# Patient Record
Sex: Female | Born: 1970 | Race: White | Hispanic: No | Smoking: Never smoker
Health system: Southern US, Community
[De-identification: ages and names within clinical notes are randomized; demographics above are authoritative.]

## PROBLEM LIST (undated history)

## (undated) ENCOUNTER — Emergency Department (HOSPITAL_COMMUNITY): Payer: 59

## (undated) ENCOUNTER — Inpatient Hospital Stay (HOSPITAL_COMMUNITY): Payer: Self-pay

## (undated) DIAGNOSIS — Z1509 Genetic susceptibility to other malignant neoplasm: Secondary | ICD-10-CM

## (undated) DIAGNOSIS — F329 Major depressive disorder, single episode, unspecified: Secondary | ICD-10-CM

## (undated) DIAGNOSIS — Z800 Family history of malignant neoplasm of digestive organs: Secondary | ICD-10-CM

## (undated) DIAGNOSIS — K635 Polyp of colon: Secondary | ICD-10-CM

## (undated) DIAGNOSIS — T8859XA Other complications of anesthesia, initial encounter: Secondary | ICD-10-CM

## (undated) DIAGNOSIS — K8020 Calculus of gallbladder without cholecystitis without obstruction: Secondary | ICD-10-CM

## (undated) DIAGNOSIS — Z8619 Personal history of other infectious and parasitic diseases: Secondary | ICD-10-CM

## (undated) DIAGNOSIS — K219 Gastro-esophageal reflux disease without esophagitis: Secondary | ICD-10-CM

## (undated) DIAGNOSIS — F53 Postpartum depression: Secondary | ICD-10-CM

## (undated) DIAGNOSIS — F32A Depression, unspecified: Secondary | ICD-10-CM

## (undated) DIAGNOSIS — E669 Obesity, unspecified: Secondary | ICD-10-CM

## (undated) DIAGNOSIS — T4145XA Adverse effect of unspecified anesthetic, initial encounter: Secondary | ICD-10-CM

## (undated) DIAGNOSIS — E039 Hypothyroidism, unspecified: Secondary | ICD-10-CM

## (undated) DIAGNOSIS — IMO0002 Reserved for concepts with insufficient information to code with codable children: Secondary | ICD-10-CM

## (undated) DIAGNOSIS — R87619 Unspecified abnormal cytological findings in specimens from cervix uteri: Secondary | ICD-10-CM

## (undated) DIAGNOSIS — O99345 Other mental disorders complicating the puerperium: Secondary | ICD-10-CM

## (undated) DIAGNOSIS — O09529 Supervision of elderly multigravida, unspecified trimester: Secondary | ICD-10-CM

## (undated) HISTORY — DX: Major depressive disorder, single episode, unspecified: F32.9

## (undated) HISTORY — DX: Obesity, unspecified: E66.9

## (undated) HISTORY — DX: Adverse effect of unspecified anesthetic, initial encounter: T41.45XA

## (undated) HISTORY — PX: COLPOSCOPY: SHX161

## (undated) HISTORY — DX: Other complications of anesthesia, initial encounter: T88.59XA

## (undated) HISTORY — DX: Unspecified abnormal cytological findings in specimens from cervix uteri: R87.619

## (undated) HISTORY — DX: Genetic susceptibility to other malignant neoplasm: Z15.09

## (undated) HISTORY — PX: MOUTH SURGERY: SHX715

## (undated) HISTORY — PX: EYE SURGERY: SHX253

## (undated) HISTORY — DX: Family history of malignant neoplasm of digestive organs: Z80.0

## (undated) HISTORY — DX: Polyp of colon: K63.5

## (undated) HISTORY — DX: Reserved for concepts with insufficient information to code with codable children: IMO0002

## (undated) HISTORY — PX: LAPAROSCOPY: SHX197

## (undated) HISTORY — DX: Depression, unspecified: F32.A

## (undated) HISTORY — PX: TONSILLECTOMY: SUR1361

## (undated) HISTORY — DX: Other mental disorders complicating the puerperium: O99.345

## (undated) HISTORY — DX: Supervision of elderly multigravida, unspecified trimester: O09.529

## (undated) HISTORY — DX: Personal history of other infectious and parasitic diseases: Z86.19

## (undated) HISTORY — DX: Postpartum depression: F53.0

---

## 2009-12-31 ENCOUNTER — Encounter: Admission: RE | Admit: 2009-12-31 | Discharge: 2009-12-31 | Payer: Self-pay | Admitting: Obstetrics and Gynecology

## 2011-01-17 ENCOUNTER — Ambulatory Visit (HOSPITAL_COMMUNITY): Admission: RE | Admit: 2011-01-17 | Payer: Self-pay | Source: Ambulatory Visit

## 2011-01-17 ENCOUNTER — Ambulatory Visit (HOSPITAL_COMMUNITY)
Admission: RE | Admit: 2011-01-17 | Discharge: 2011-01-17 | Disposition: A | Discharge status: Home / Self Care | Payer: 59 | Source: Ambulatory Visit | Attending: Interventional Cardiology | Admitting: Interventional Cardiology

## 2011-01-17 DIAGNOSIS — R079 Chest pain, unspecified: Secondary | ICD-10-CM | POA: Insufficient documentation

## 2011-02-27 ENCOUNTER — Other Ambulatory Visit: Payer: Self-pay

## 2011-07-24 ENCOUNTER — Other Ambulatory Visit: Payer: Self-pay

## 2011-08-14 LAB — OB RESULTS CONSOLE HEPATITIS B SURFACE ANTIGEN: Hepatitis B Surface Ag: NEGATIVE

## 2011-08-14 LAB — OB RESULTS CONSOLE RPR: RPR: NONREACTIVE

## 2011-09-04 ENCOUNTER — Other Ambulatory Visit: Payer: Self-pay

## 2011-09-04 ENCOUNTER — Other Ambulatory Visit (HOSPITAL_COMMUNITY)
Admission: RE | Admit: 2011-09-04 | Discharge: 2011-09-04 | Disposition: A | Discharge status: Home / Self Care | Payer: 59 | Source: Ambulatory Visit | Attending: Obstetrics and Gynecology | Admitting: Obstetrics and Gynecology

## 2011-09-04 DIAGNOSIS — Z113 Encounter for screening for infections with a predominantly sexual mode of transmission: Secondary | ICD-10-CM | POA: Insufficient documentation

## 2011-09-04 DIAGNOSIS — Z01419 Encounter for gynecological examination (general) (routine) without abnormal findings: Secondary | ICD-10-CM | POA: Insufficient documentation

## 2011-09-04 LAB — OB RESULTS CONSOLE GC/CHLAMYDIA
Chlamydia: NEGATIVE
Gonorrhea: NEGATIVE

## 2011-10-15 ENCOUNTER — Other Ambulatory Visit: Payer: Self-pay | Admitting: Obstetrics and Gynecology

## 2011-10-15 DIAGNOSIS — Z3689 Encounter for other specified antenatal screening: Secondary | ICD-10-CM

## 2011-10-15 DIAGNOSIS — O09529 Supervision of elderly multigravida, unspecified trimester: Secondary | ICD-10-CM

## 2011-10-27 ENCOUNTER — Encounter (HOSPITAL_COMMUNITY): Payer: Self-pay

## 2011-10-27 ENCOUNTER — Ambulatory Visit (HOSPITAL_COMMUNITY)
Admission: RE | Admit: 2011-10-27 | Discharge: 2011-10-27 | Disposition: A | Discharge status: Home / Self Care | Payer: 59 | Source: Ambulatory Visit | Attending: Obstetrics and Gynecology | Admitting: Obstetrics and Gynecology

## 2011-10-27 VITALS — BP 121/74 | HR 90 | Wt 279.0 lb

## 2011-10-27 DIAGNOSIS — Z3689 Encounter for other specified antenatal screening: Secondary | ICD-10-CM

## 2011-10-27 DIAGNOSIS — Z1389 Encounter for screening for other disorder: Secondary | ICD-10-CM | POA: Insufficient documentation

## 2011-10-27 DIAGNOSIS — Z363 Encounter for antenatal screening for malformations: Secondary | ICD-10-CM | POA: Insufficient documentation

## 2011-10-27 DIAGNOSIS — O358XX0 Maternal care for other (suspected) fetal abnormality and damage, not applicable or unspecified: Secondary | ICD-10-CM | POA: Insufficient documentation

## 2011-10-27 DIAGNOSIS — E669 Obesity, unspecified: Secondary | ICD-10-CM | POA: Insufficient documentation

## 2011-10-27 DIAGNOSIS — O09529 Supervision of elderly multigravida, unspecified trimester: Secondary | ICD-10-CM | POA: Insufficient documentation

## 2011-10-27 NOTE — Progress Notes (Signed)
Grace Graves  was seen today for an ultrasound appointment.  See full report in [facility].  Alpha Gula, MD  Single IUP at 18 0/7 weeks Normal detailed fetal anatomy; somewhat limited views of the fetal heart were obtained (RVOT) No markers associated with aneuploidy were noted Normal amniotic fluid volume  Patient had low risk cell free fetal DNA testing ([facility])  Recommend follow up ultrasound in 4 weeks to reevaluate the fetal heart.

## 2011-11-26 ENCOUNTER — Ambulatory Visit (HOSPITAL_COMMUNITY)
Admission: RE | Admit: 2011-11-26 | Discharge: 2011-11-26 | Disposition: A | Discharge status: Home / Self Care | Payer: 59 | Source: Ambulatory Visit | Attending: Obstetrics and Gynecology | Admitting: Obstetrics and Gynecology

## 2011-11-26 DIAGNOSIS — O358XX0 Maternal care for other (suspected) fetal abnormality and damage, not applicable or unspecified: Secondary | ICD-10-CM | POA: Insufficient documentation

## 2011-11-26 DIAGNOSIS — Z1389 Encounter for screening for other disorder: Secondary | ICD-10-CM | POA: Insufficient documentation

## 2011-11-26 DIAGNOSIS — O09529 Supervision of elderly multigravida, unspecified trimester: Secondary | ICD-10-CM | POA: Insufficient documentation

## 2011-11-26 DIAGNOSIS — Z363 Encounter for antenatal screening for malformations: Secondary | ICD-10-CM | POA: Insufficient documentation

## 2011-11-26 DIAGNOSIS — O99210 Obesity complicating pregnancy, unspecified trimester: Secondary | ICD-10-CM | POA: Insufficient documentation

## 2011-11-26 DIAGNOSIS — Z3689 Encounter for other specified antenatal screening: Secondary | ICD-10-CM

## 2011-11-26 DIAGNOSIS — E669 Obesity, unspecified: Secondary | ICD-10-CM | POA: Insufficient documentation

## 2011-11-26 NOTE — Progress Notes (Signed)
Grace Graves  was seen today for an ultrasound appointment.  See full report in [facility].  Alpha Gula, MD  Single IUP at 22 2/7 weeks Normal interval anatomy. Normal fetal heart views were obtained. No markers associated with aneuploidy were noted Normal amniotic fluid volume  Patient had low risk cell free fetal DNA testing ([facility])  Recommend follow up ultrasound as clinically indicated

## 2012-02-13 ENCOUNTER — Inpatient Hospital Stay (HOSPITAL_COMMUNITY)
Admission: AD | Admit: 2012-02-13 | Discharge: 2012-02-13 | Disposition: A | Discharge status: Home / Self Care | Payer: 59 | Source: Ambulatory Visit | Attending: Obstetrics and Gynecology | Admitting: Obstetrics and Gynecology

## 2012-02-13 ENCOUNTER — Encounter (HOSPITAL_COMMUNITY): Payer: Self-pay | Admitting: *Deleted

## 2012-02-13 ENCOUNTER — Other Ambulatory Visit: Payer: Self-pay | Admitting: Obstetrics and Gynecology

## 2012-02-13 DIAGNOSIS — O99891 Other specified diseases and conditions complicating pregnancy: Secondary | ICD-10-CM | POA: Insufficient documentation

## 2012-02-13 LAB — WET PREP, GENITAL: Yeast Wet Prep HPF POC: NONE SEEN

## 2012-02-13 LAB — AMNISURE RUPTURE OF MEMBRANE (ROM) NOT AT ARMC: Amnisure ROM: NEGATIVE

## 2012-02-13 NOTE — MAU Note (Signed)
Pt sent from MD office, ? SROM, had neg fern in MD office, but pt is feeling wet, having to wear pad.  Some sharp lower abd pain, no bleeding.

## 2012-02-13 NOTE — MAU Provider Note (Signed)
S: 41 y.o. [id number] @[redacted]w[redacted]d  presents to MAU from office for r/o SROM.  She reports leaking clear fluid enough to feel wet x2-3 days, but has not required a pad for this.  She reports good fetal movement, denies vaginal bleeding, vaginal itching/burning, urinary symptoms, h/a, dizziness, n/v, or fever/chills.    O: BP 131/78  Pulse 94  Temp 98.2 F (36.8 C) (Oral)  Resp 18  LMP 06/23/2011  Results for orders placed during the hospital encounter of 02/13/12 (from the past 24 hour(s))  AMNISURE RUPTURE OF MEMBRANE (ROM)     Status: Normal   Collection Time   02/13/12  6:10 PM      Component Value Range   Amnisure ROM NEGATIVE    WET PREP, GENITAL     Status: Abnormal   Collection Time   02/13/12  6:10 PM      Component Value Range   Yeast Wet Prep HPF POC NONE SEEN  NONE SEEN   Trich, Wet Prep NONE SEEN  NONE SEEN   Clue Cells Wet Prep HPF POC NONE SEEN  NONE SEEN   WBC, Wet Prep HPF POC MODERATE (*) NONE SEEN   Dilation: 1 Effacement (%): Thick Cervical Position: Posterior Station: -3 Presentation: Vertex Exam by:: L. Leftwich-Kirby CNM  EFM Category I, with irritability on toco, mild to palpation  A: Intact membranes  P: RN to call Dr Dion Body to report results   Sharen Counter Certified Nurse-Midwife

## 2012-02-23 ENCOUNTER — Observation Stay (HOSPITAL_COMMUNITY): Payer: 59

## 2012-02-23 ENCOUNTER — Observation Stay (HOSPITAL_COMMUNITY)
Admission: AD | Admit: 2012-02-23 | Discharge: 2012-02-25 | Disposition: A | Discharge status: Home / Self Care | Payer: 59 | Source: Ambulatory Visit | Attending: Obstetrics and Gynecology | Admitting: Obstetrics and Gynecology

## 2012-02-23 ENCOUNTER — Encounter (HOSPITAL_COMMUNITY): Payer: Self-pay | Admitting: *Deleted

## 2012-02-23 DIAGNOSIS — O4700 False labor before 37 completed weeks of gestation, unspecified trimester: Principal | ICD-10-CM | POA: Insufficient documentation

## 2012-02-23 DIAGNOSIS — O09529 Supervision of elderly multigravida, unspecified trimester: Secondary | ICD-10-CM | POA: Insufficient documentation

## 2012-02-23 DIAGNOSIS — O368390 Maternal care for abnormalities of the fetal heart rate or rhythm, unspecified trimester, not applicable or unspecified: Secondary | ICD-10-CM | POA: Insufficient documentation

## 2012-02-23 LAB — COMPREHENSIVE METABOLIC PANEL
AST: 12 U/L (ref 0–37)
Albumin: 2.6 g/dL — ABNORMAL LOW (ref 3.5–5.2)
Alkaline Phosphatase: 103 U/L (ref 39–117)
CO2: 22 mEq/L (ref 19–32)
Chloride: 102 mEq/L (ref 96–112)
Potassium: 3.9 mEq/L (ref 3.5–5.1)
Total Bilirubin: 0.3 mg/dL (ref 0.3–1.2)

## 2012-02-23 LAB — TYPE AND SCREEN: Antibody Screen: NEGATIVE

## 2012-02-23 LAB — CBC
HCT: 34.9 % — ABNORMAL LOW (ref 36.0–46.0)
Hemoglobin: 11.2 g/dL — ABNORMAL LOW (ref 12.0–15.0)
MCH: 28.1 pg (ref 26.0–34.0)
MCHC: 32.1 g/dL (ref 30.0–36.0)
RDW: 15 % (ref 11.5–15.5)

## 2012-02-23 LAB — OB RESULTS CONSOLE GBS
GBS: NEGATIVE
GBS: NEGATIVE
GBS: NEGATIVE

## 2012-02-23 MED ORDER — NIFEDIPINE 10 MG PO CAPS
10.0000 mg | ORAL_CAPSULE | Freq: Four times a day (QID) | ORAL | Status: DC
  Administered 2012-02-24 (×2): 10 mg via ORAL
  Filled 2012-02-23: qty 2
  Filled 2012-02-23 (×2): qty 1

## 2012-02-23 MED ORDER — SODIUM CHLORIDE 0.9 % IJ SOLN: 3.0000 mL | INTRAMUSCULAR | Status: DC | PRN

## 2012-02-23 MED ORDER — LACTATED RINGERS IV BOLUS (SEPSIS)
1000.0000 mL | Freq: Once | INTRAVENOUS | Status: AC
  Administered 2012-02-23: 1000 mL via INTRAVENOUS

## 2012-02-23 MED ORDER — SODIUM CHLORIDE 0.9 % IV SOLN: 250.0000 mL | INTRAVENOUS | Status: DC | PRN

## 2012-02-23 MED ORDER — PENICILLIN G POTASSIUM 5000000 UNITS IJ SOLR
5.0000 10*6.[IU] | Freq: Once | INTRAVENOUS | Status: AC
  Administered 2012-02-23: 5 10*6.[IU] via INTRAVENOUS
  Filled 2012-02-23: qty 5

## 2012-02-23 MED ORDER — DOCUSATE SODIUM 100 MG PO CAPS
100.0000 mg | ORAL_CAPSULE | Freq: Every day | ORAL | Status: DC
  Administered 2012-02-24: 100 mg via ORAL
  Filled 2012-02-23: qty 1

## 2012-02-23 MED ORDER — LACTATED RINGERS IV SOLN
INTRAVENOUS | Status: DC
  Administered 2012-02-23 – 2012-02-24 (×3): via INTRAVENOUS

## 2012-02-23 MED ORDER — ZOLPIDEM TARTRATE 5 MG PO TABS
5.0000 mg | ORAL_TABLET | Freq: Every evening | ORAL | Status: DC | PRN
  Administered 2012-02-24: 5 mg via ORAL
  Filled 2012-02-23: qty 1

## 2012-02-23 MED ORDER — ACETAMINOPHEN 325 MG PO TABS
650.0000 mg | ORAL_TABLET | ORAL | Status: DC | PRN
  Administered 2012-02-24 (×3): 650 mg via ORAL
  Filled 2012-02-23 (×3): qty 2

## 2012-02-23 MED ORDER — PENICILLIN G POTASSIUM 5000000 UNITS IJ SOLR
2.5000 10*6.[IU] | INTRAVENOUS | Status: DC
  Administered 2012-02-23 – 2012-02-25 (×8): 2.5 10*6.[IU] via INTRAVENOUS
  Filled 2012-02-23 (×12): qty 2.5

## 2012-02-23 MED ORDER — SODIUM CHLORIDE 0.9 % IJ SOLN: 3.0000 mL | Freq: Two times a day (BID) | INTRAMUSCULAR | Status: DC

## 2012-02-23 MED ORDER — CALCIUM CARBONATE ANTACID 500 MG PO CHEW: 2.0000 | CHEWABLE_TABLET | ORAL | Status: DC | PRN

## 2012-02-23 MED ORDER — PRENATAL MULTIVITAMIN CH
1.0000 | ORAL_TABLET | Freq: Every day | ORAL | Status: DC
  Administered 2012-02-24: 1 via ORAL
  Filled 2012-02-23: qty 1

## 2012-02-23 MED ORDER — NIFEDIPINE 10 MG PO CAPS
20.0000 mg | ORAL_CAPSULE | Freq: Once | ORAL | Status: AC
  Administered 2012-02-23: 20 mg via ORAL

## 2012-02-23 NOTE — Consult Note (Signed)
[facility]  Neonatal Medicine Consultation       02/23/2012    10:19 PM  I was called at the request of the patient's obstetrician (DR. Varnado) to speak to this patient due to preterm labor at 35 weeks.  I discussed likely clinical course for a baby born this early, including mortality, length of stay, respiratory distress, infection, feeding.  I answered the patient's questions.   _____________________ Electronically Signed By: Angelita Ingles, MD Neonatologist

## 2012-02-23 NOTE — H&P (Signed)
Grace Graves is a 42 y.o. female G5 P51 at 32 0/7 weeks c/w 9 week ultrasound admitted for onset of contractions.  Pt works on feet in hospital and reports contractions started more than 6 hours prior to arrival to office.  Denied LOF or vaginal bleeding.  Denied sexual intercourse within the last 72 hours.  1-2 weeks ago pt was sent to rule out PTL  Due to contractions.  Cervix at that time was 1/thick/high.  Uterine irritability noted, no cervical change.  Pt sent home and rested over the weekend and stated that contractions resolved.  Pt reduced work hours and was stable.   PNC complicated by AMA.  [facility] after 10 weeks was low risk.  Level 2 ultrasound was wnl.  Growth ultrasounds have been reassuring.  Pt with a h/o pre-hypertension prior to pregnancy.  Baseline 24 hour urine WNL.  BP has been normal to high normal.   Maternal Medical History:  Reason for admission: Reason for admission: contractions and nausea.  Contractions: Onset was 6-12 hours ago.   Frequency: regular.   Duration is approximately 5 minutes.   Perceived severity is mild.    Fetal activity: Perceived fetal activity is normal.    Prenatal complications: Hypertension.   Pt with a h/o pre-hypertension prior to pregnancy.  BP has been high normal.  24 hr urine protein (baseline) wnl.  Prenatal Complications - Diabetes: none.    OB History    Grav Para Term Preterm Abortions TAB SAB Ect Mult Living   4 2 2  0 1 0 1 0 0 2     Past Medical History  Diagnosis Date  . No pertinent past medical history    Past Surgical History  Procedure Date  . Eye surgery   . Tonsillectomy   . Laparoscopy    Family History: family history includes Arthritis in her mother; COPD in her mother; Cancer in her mother; Depression in her father, mother, and sister; Diabetes in her mother; Heart disease in her mother; Hyperlipidemia in her mother; Hypertension in her father and mother; and Kidney disease in her sister. Social History:   reports that she has never smoked. She does not have any smokeless tobacco history on file. She reports that she does not drink alcohol or use illicit drugs.   Prenatal Transfer Tool  Maternal Diabetes: No Genetic Screening: Normal Maternal Ultrasounds/Referrals: Normal Fetal Ultrasounds or other Referrals:  None Maternal Substance Abuse:  No Significant Maternal Medications:  None Significant Maternal Lab Results:  None Other Comments:  GBS pending. AMA, morbid obesity  Review of Systems  Eyes: Negative for blurred vision.  Respiratory: Negative for sputum production.   Gastrointestinal: Positive for nausea and abdominal pain.  Genitourinary:       Denies LOF, Vaginal bleeding, loss of mucus plug.      Blood pressure 136/73, pulse 97, temperature 98 F (36.7 C), temperature source Oral, resp. rate 18, height 5\' 9"  (1.753 m), weight 126.1 kg (278 lb), last menstrual period 06/23/2011. Maternal Exam:  Uterine Assessment: Contraction strength is mild.  Contraction duration is 5 minutes. Contraction frequency is regular.   Abdomen: Fundal height is 36 cm.   Estimated fetal weight is By ultrasound on 12/17, not macrosomic.   Fetal presentation: vertex  Introitus: Normal vulva. Normal vagina.  Ferning test: not done.  Nitrazine test: not done.  Pelvis: adequate for delivery.   Cervix: Cervix evaluated by sterile speculum exam and digital exam.   3-4/30/high  Fetal Exam Fetal Monitor  Review: Variability: moderate (6-25 bpm).   Pattern: accelerations present.   Late decel noted with 1 contraction.  Fetal State Assessment: Category I - tracings are normal.     Physical Exam  Constitutional: She is oriented to person, place, and time. She appears well-developed and well-nourished. No distress.  HENT:  Head: Normocephalic and atraumatic.  Eyes: EOM are normal.  Neck: Normal range of motion.  Cardiovascular: Normal rate, regular rhythm and normal heart sounds.     Respiratory: Effort normal and breath sounds normal. No respiratory distress.  GI: There is no tenderness.  Genitourinary: Vagina normal and uterus normal.  Neurological: She is alert and oriented to person, place, and time.  Skin: Skin is warm and dry. She is not diaphoretic.  Psychiatric: She has a normal mood and affect.    Prenatal labs: ABO, Rh:   Antibody:   Rubella:   RPR:    HBsAg:    HIV:    GBS:     Assessment/Plan: PTL at 35 0/7 weeks.   Extreme AMA, genetic screening negative. H/o elevated BP prior to pregnancy. Given Procardia for tocolysis and IVF, which has seemed to resolve contractions.  R/o UTI by culture.  Wet prep negative 1+ weeks ago. FFN collected results pending. GBS collected in office today.  PCN for GBS prophylaxis until stable. Ultrasound done for presentation, EFW.  Per pt, she is breech and ~5 1/2 pounds.  Preliminary and/or final report pending. Pt counseled if labor progresses at this time, primary c-section recommended due to breech presentation. Deceleration x 1, Negative CST.  Continuous fetal monitoring. NICU consult ordered. Discussed plan at length with pt and husband.  All questions answered.  Dr. Richardson Dopp to see pt tomorrow,will check out in am.  Geryl Rankins 02/23/2012, 8:34 PM

## 2012-02-24 ENCOUNTER — Inpatient Hospital Stay (HOSPITAL_COMMUNITY): Payer: 59

## 2012-02-24 ENCOUNTER — Encounter (HOSPITAL_COMMUNITY): Payer: 59

## 2012-02-24 LAB — URINE CULTURE

## 2012-02-24 LAB — ABO/RH: ABO/RH(D): A POS

## 2012-02-24 NOTE — Progress Notes (Signed)
Patient ID: Grace Graves, female   DOB: May 10, 1970, 42 y.o.   MRN: [id number] Recommendation d/w Dr. Marjo Bicker MFM.  Consultation very much appreciated. Will discontinue Procardia and start 24 hour urine collection. Cont fetal monitoring and tocometry.   Discussed plan with Dr. Richardson Dopp.

## 2012-02-24 NOTE — Progress Notes (Signed)
42 y/o at 35 wks and 1 day admitted by Dr. Dion Body yesterday with preterm labor. She was started on procardia. She ws 3 cm on admission. She denies current contractions. /+ FM no lof no vaginal bleeding. BPP today was 8/8.Marland Kitchen On u/s her baby was frank breech presentation.   AFVSS FHR baseline 130's moderate variability: accelerations are present .Marland Kitchen No current decelerations.  Toco no contractions noted.  Abd gravid nontender  Ext no edema   A/P 35 wks 1day with preterm labor / breech presentation/ AMA/ morbid obesity  MFM consult reviewed with Dr. Dion Body. Discontinue procardia. Continue inpatient management overnight if stable d/c home in AM Plan for 24 hour urine collection Antenatal testing outpatient.

## 2012-02-24 NOTE — Progress Notes (Signed)
UR completed 

## 2012-02-24 NOTE — Consult Note (Signed)
MFM Staff Note  Discussion:   By way of consultation, I spoke to your patient about her care.  The patient is beyond 34 weeks with advanced cervical dilation.  While complications resulting from use of tocolytic agents are rare in the well monitored patient, current guidelines by ACOG do not support aggressive tocolytic therapy beyond 34 weeks.  While it can be done (tocolysis), my recommendation is not to do so.  If she labors with the fetus in breech presentation, she will need a cesarean.  However, it appears that her uterine activity and cervical dilation have reached a quiescent state.  Today's BPP is reassuring for fetal well-being.  I would recommend a period of about 6 hours of observation of uterine activity and CEFM.  If no cervical change is noted and the FHT is reassuring, she is a candidate for outpatient prenatal care with fetal surveillance due to her condition of extreme AMA and morbid obesity.    I did review with the patient the risk of extremely advanced maternal age (>24 years old) and obesity as they relates to pregnancy and that there exists data which suggests a causal association between maternal obesity, extreme AMA and several pregnancy complications.      Risk to pregnancy increases as the age and BMI of the patient increases.  Risks inherent to such pregnancies include, but are not limited to: miscarriage, intrauterine fetal demise, gestational diabetes, preeclampsia, and gestational hypertension.  There is a potential for inadequate ultrasound screening for fetal anomalies and fetal well-being due to the obesity thus making it difficult to diagnose fetal abnormalities and or monitor the pregnancy.  Also, there is an increased need for induction of labor, increased labor dystocia, increased incidents of larger gestational age neonates, increased risk for cesarean delivery and postpartum wound complications.    Currently, she has had some borderline elevation of blood pressure,  making close surveillance for early detection of preeclampsia necessary.  She is high risk for this and has a history.  I recommend blood pressure monitoring twice weekly with NST and also at the time of her routine weekly prenatal visit.    Impressions 1. Active singleton IUP at 35 1/7 wks in gestation complicated by extreme AMA (>40 yo), morbid obesity, and preterm labor; 2. Frank breech presentation; 3. Normal AFV; 4. borderline HTN 5. reassuring BPP 8/8  Inpatient Recommendations and criteria for discharge: 1. Prolonged monitoring with CEFM until there has been at least six hours with out recurrent fetal heart rate decelerations and there is a reactive NST; 2. Discontinue tocolytic agents at this gestational age as risks of the medication exceed risks of late preterm delivery; 3. If no progression of cervical exam occurs and she is deemed stable with advanced cervical dilation, the patient may be discharged home on modified rest/reduced activity (not bedrest however as this would increase risk for DVT/VTE);  Recommended outpatient surveillance: 1. Twice weekly NST with maternal blood pressure measurement; 2. Weekly prenatal visit; 3. weekly AFI and interval growth ultrasounds every 3 weeks; 4. I would repeat a 24 hour urine collection for protein and creatinine clearance; 5. if the fetus remains breech, external cephalic version could be offered at around 37 weeks; 6. if spontaneous labor does not occur and the patient does not develop preeclampsia, I would recommend delivery at 39-40 weeks due to extreme AMA (and increased risk of stillbirth after 40 weeks relative to the healthy obstetric population).  I spent in excess of 60 minutes in review of medical  records, evaluation, and education of your patient in consultation.  More than 50% of this time was spent in direct face-to-face counseling.  It was a pleasure seeing your patient today in consultation.  Thank you for allowing [place] the  opportunity to contribute to the care of your patient.  Page with questions.  Merideth Abbey, MD, MS, FACOG Assistant Professor, Maternal-Fetal Medicine

## 2012-02-24 NOTE — Progress Notes (Signed)
Called in for update on pt. Informed that pt was having 1-2 contractions hourly and pt sleeping.  Noticed intermittent decels with contractions, ~50% contractions pt has decel but is very reactive before and after. Reviewed tracing.  Significant spontaneous, late decels x 2 minutes.  Reactive.  Pt previously with Neg CST on admission.  S/p Procardia decels noted.  Rn called to get verbal report on ultrasound.  Per RN, AFI was 15 cm, Breech.  No info given on fetal activity on ultrasound. Final pending. Will order MFM consult in office today for BPP and recommendations.  Given pt has higher normal BP and now it is lower, may need to hold Procardia. No signs of bleeding or pain so abruption low. Fetal status overall reassuring.  Continuous fetal monitoring.

## 2012-02-25 NOTE — Discharge Summary (Signed)
Physician Discharge Summary  Patient ID: Grace Graves MRN: [id number] DOB/AGE: April 09, 1970 42 y.o.  Admit date: 02/23/2012 Discharge date: 02/25/2012  Admission Diagnoses:  PTL @ 35 0/7 weeks, AMA  Discharge Diagnoses: same, fetal decelerations Active Problems:  * No active hospital problems. *    Discharged Condition: good  Hospital Course: Pt given Procardia and IVF on arrival.  Was contracting ~ q 5 minutes.  Ultrasound done which showed Breech infant, 2360 grams (67%), AFI 15 cm.  During prolonged fetal monitoring on HD #1, spontaneous and late decels noted, x 2-3 minutes.  MFM consult done.  BPP 8/8.  Recommended discontinuing Procardia and collecting 24 hour urine due to h/o prehypertension and high normal BP.  BP on arrival was normal, lower with Procardia.  After Procardia, BP still remained normal.  Cervical exam prior to discharge was unchanged.  Contractions were 1-3 per hour.  Fetal decelerations decreased in frequency and severity.  Consults: MFM, Neonatology  Significant Diagnostic Studies: labs: PIH labs negative  Treatments: IV hydration and procardia  Discharge Exam: Blood pressure 131/69, pulse 92, temperature 97.9 F (36.6 C), temperature source Oral, resp. rate 18, height 5\' 9"  (1.753 m), weight 126.1 kg (278 lb), last menstrual period 06/23/2011. wnl  Disposition: 01-Home or Self Care  Discharge Orders    Future Appointments: Provider: Department: Dept Phone: Center:   03/02/2012 8:30 AM [facility] [place] [facility] [id number] [facility]     Future Orders Please Complete By Expires   Discharge instructions      Comments:   See discharge instructions.   PRETERM LABOR:  Includes any of the follwing symptoms that occur between 20 - [redacted] weeks gestation.  If these symptoms are not stopped, preterm labor can result in preterm delivery, placing your baby at risk      Notify physician for menstrual like cramps      Notify physician for  uterine contractions.  These may be painless and feel like the uterus is tightening or the baby is  "balling up"      Notify physician for low, dull backache, unrelieved by heat or Tylenol      Notify physician for intestinal cramps, with or without diarrhea, sometimes described as "gas pain"      Notify physician for pelvic pressure      Notify physician for increase or change in vaginal discharge      Notify physician for vaginal bleeding      Notify physician for a general feeling that "something is not right"      Notify physician for leaking of fluid      Fetal Kick Count:  Lie on our left side for one hour after a meal, and count the number of times your baby kicks.  If it is less than 5 times, get up, move around and drink some juice.  Repeat the test 30 minutes later.  If it is still less than 5 kicks in an hour, notify your doctor.      Discharge activity:      Comments:   Bedrest with bathroom privileges.  Out of work.   Discharge diet:      Comments:   Low sodium.   Do not have sex or do anything that might make you have an orgasm          Medication List     As of 02/25/2012  8:52 AM    TAKE these medications  famotidine 20 MG tablet   Commonly known as: PEPCID   Take 20 mg by mouth 2 (two) times daily as needed. heartburn      prenatal multivitamin Tabs   Take 1 tablet by mouth daily.           Follow-up Information    Follow up with Geryl Rankins, MD. Schedule an appointment as soon as possible for a visit on 02/27/2012. (NST, F/u Preterm labor)    Contact information:   [address], [address] [place] [address] [phone]       Follow up with Geryl Rankins, MD. On 02/25/2012. (Submit 24 hour today.)    Contact information:   [address], [address] [place] [address] [phone]          Signed: Geryl Rankins 02/25/2012, 8:52 AM

## 2012-02-25 NOTE — Progress Notes (Signed)
Patient ID: Grace Graves, female   DOB: February 14, 1971, 42 y.o.   MRN: [id number] Pt comfortable.  Reports contractions every 2 minutes overnight but resolved with positioning.  Pt is comfortable now.  Denies LOF but has had some intermittent bloody mucus since admission.  Active fetus noted. Pt is currently breech.  Declines external version at 37 weeks.  Will await for baby to turn spontaneously or proceed with c-section at 39 weeks w/ BTL. AFVSS BPs normal Gen:  NAD Cervix unchanged 3/50/-3, not vertex EM:  Reactive, one subtle decel at 0400.  In the last 6 hours, 1-3 contractions hourly. A/P: IUP at 35 2/7 weeks PTL but now stable. Fetal decelerations, reassuring BPP. Breech. H/o Prehypertension prior to pregnancy.  24 hour urine collection in progress. Discharge home on bedrest. Pt out of work. Pt to drop off urine this afternoon at my office. NST in 2 days.

## 2012-02-26 NOTE — MAU Provider Note (Signed)
I assume I was aware but the note states called to Dr Dion Body and will cosign for her as agree with above plan

## 2012-03-01 ENCOUNTER — Other Ambulatory Visit (HOSPITAL_COMMUNITY): Payer: Self-pay | Admitting: Obstetrics and Gynecology

## 2012-03-01 DIAGNOSIS — O169 Unspecified maternal hypertension, unspecified trimester: Secondary | ICD-10-CM

## 2012-03-01 DIAGNOSIS — O09529 Supervision of elderly multigravida, unspecified trimester: Secondary | ICD-10-CM

## 2012-03-02 ENCOUNTER — Ambulatory Visit (HOSPITAL_COMMUNITY)
Admit: 2012-03-02 | Discharge: 2012-03-02 | Disposition: A | Discharge status: Home / Self Care | Payer: 59 | Attending: Obstetrics and Gynecology | Admitting: Obstetrics and Gynecology

## 2012-03-02 DIAGNOSIS — O169 Unspecified maternal hypertension, unspecified trimester: Secondary | ICD-10-CM

## 2012-03-02 DIAGNOSIS — O09529 Supervision of elderly multigravida, unspecified trimester: Secondary | ICD-10-CM | POA: Insufficient documentation

## 2012-03-02 DIAGNOSIS — O99210 Obesity complicating pregnancy, unspecified trimester: Secondary | ICD-10-CM | POA: Insufficient documentation

## 2012-03-02 DIAGNOSIS — E669 Obesity, unspecified: Secondary | ICD-10-CM | POA: Insufficient documentation

## 2012-03-02 NOTE — Progress Notes (Signed)
Grace Graves  was seen today for an ultrasound appointment.  See full report in [facility].  Grace Graves returns for follow up.   A limited ultrasound was performed for amniotic fluid assessment only.  Recent preeclampsia labs reveal a 24-hr urine protein of 460 mg/24 hrs (had a baseline 24-hr urine protein of 173 mg/24 hrs).  The remainder of the lab work is within normal limits.  Assuming the patient meets blood pressure citeria (140's/90's), this would be consisent with a diagnosis of mild preeclampsia and would recommend induction of labor at 37 weeks.  Single IUP at 36 1/7 weeks Limited ultrasound performed for amniotic fluid volume assessment An AFI of 19 cm was noted Cephalic presentation  See comments above Continue 2x weekly NSTs Recommend induction of labor at 37 weeks for mild preeclampsia  Alpha Gula, MD

## 2012-03-05 ENCOUNTER — Telehealth (HOSPITAL_COMMUNITY): Payer: Self-pay | Admitting: *Deleted

## 2012-03-05 ENCOUNTER — Other Ambulatory Visit: Payer: Self-pay | Admitting: Obstetrics and Gynecology

## 2012-03-05 ENCOUNTER — Encounter (HOSPITAL_COMMUNITY): Payer: Self-pay | Admitting: *Deleted

## 2012-03-05 NOTE — Telephone Encounter (Signed)
Preadmission screen  

## 2012-03-08 ENCOUNTER — Encounter (HOSPITAL_COMMUNITY): Payer: Self-pay

## 2012-03-08 ENCOUNTER — Encounter (HOSPITAL_COMMUNITY): Payer: Self-pay | Admitting: Anesthesiology

## 2012-03-08 ENCOUNTER — Inpatient Hospital Stay (HOSPITAL_COMMUNITY): Payer: 59 | Admitting: Anesthesiology

## 2012-03-08 ENCOUNTER — Inpatient Hospital Stay (HOSPITAL_COMMUNITY)
Admission: RE | Admit: 2012-03-08 | Discharge: 2012-03-10 | DRG: 774 | Disposition: A | Discharge status: Home / Self Care | Payer: 59 | Source: Ambulatory Visit | Attending: Obstetrics and Gynecology | Admitting: Obstetrics and Gynecology

## 2012-03-08 DIAGNOSIS — O09529 Supervision of elderly multigravida, unspecified trimester: Secondary | ICD-10-CM | POA: Diagnosis present

## 2012-03-08 DIAGNOSIS — IMO0002 Reserved for concepts with insufficient information to code with codable children: Principal | ICD-10-CM | POA: Diagnosis present

## 2012-03-08 LAB — CBC
HCT: 34.4 % — ABNORMAL LOW (ref 36.0–46.0)
HCT: 34.4 % — ABNORMAL LOW (ref 36.0–46.0)
Hemoglobin: 11.3 g/dL — ABNORMAL LOW (ref 12.0–15.0)
Hemoglobin: 11.1 g/dL — ABNORMAL LOW (ref 12.0–15.0)
MCH: 28.2 pg (ref 26.0–34.0)
MCV: 86.4 fL (ref 78.0–100.0)
MCV: 87.5 fL (ref 78.0–100.0)
Platelets: 254 10*3/uL (ref 150–400)
RBC: 3.93 MIL/uL (ref 3.87–5.11)
WBC: 10 10*3/uL (ref 4.0–10.5)
WBC: 11.5 10*3/uL — ABNORMAL HIGH (ref 4.0–10.5)

## 2012-03-08 LAB — COMPREHENSIVE METABOLIC PANEL
Alkaline Phosphatase: 111 U/L (ref 39–117)
BUN: 6 mg/dL (ref 6–23)
CO2: 22 mEq/L (ref 19–32)
Chloride: 103 mEq/L (ref 96–112)
GFR calc Af Amer: >90 mL/min (ref >90)
GFR calc non Af Amer: >90 mL/min (ref >90)
Glucose, Bld: 88 mg/dL (ref 70–99)
Potassium: 3.7 mEq/L (ref 3.5–5.1)
Total Bilirubin: 0.3 mg/dL (ref 0.3–1.2)

## 2012-03-08 LAB — TYPE AND SCREEN: ABO/RH(D): A POS

## 2012-03-08 LAB — URINALYSIS, ROUTINE W REFLEX MICROSCOPIC
Bilirubin Urine: NEGATIVE
Glucose, UA: NEGATIVE mg/dL
Hgb urine dipstick: NEGATIVE
Ketones, ur: NEGATIVE mg/dL
Protein, ur: NEGATIVE mg/dL

## 2012-03-08 LAB — LACTATE DEHYDROGENASE: LDH: 156 U/L (ref 94–250)

## 2012-03-08 MED ORDER — OXYTOCIN BOLUS FROM INFUSION: 500.0000 mL | INTRAVENOUS | Status: DC

## 2012-03-08 MED ORDER — BUTORPHANOL TARTRATE 1 MG/ML IJ SOLN: 1.0000 mg | INTRAMUSCULAR | Status: DC | PRN

## 2012-03-08 MED ORDER — LACTATED RINGERS IV SOLN
INTRAVENOUS | Status: DC
  Administered 2012-03-08: 15:00:00 via INTRAVENOUS
  Administered 2012-03-08 (×2): 1000 mL via INTRAVENOUS

## 2012-03-08 MED ORDER — LIDOCAINE HCL (PF) 1 % IJ SOLN: 30.0000 mL | INTRAMUSCULAR | Status: DC | PRN

## 2012-03-08 MED ORDER — EPHEDRINE 5 MG/ML INJ
10.0000 mg | INTRAVENOUS | Status: DC | PRN
  Filled 2012-03-08: qty 4

## 2012-03-08 MED ORDER — TERBUTALINE SULFATE 1 MG/ML IJ SOLN: 0.2500 mg | Freq: Once | INTRAMUSCULAR | Status: AC | PRN

## 2012-03-08 MED ORDER — ACETAMINOPHEN 325 MG PO TABS: 650.0000 mg | ORAL_TABLET | ORAL | Status: DC | PRN

## 2012-03-08 MED ORDER — FENTANYL 2.5 MCG/ML BUPIVACAINE 1/10 % EPIDURAL INFUSION (WH - ANES)
14.0000 mL/h | INTRAMUSCULAR | Status: DC
  Administered 2012-03-08 (×2): 14 mL/h via EPIDURAL
  Filled 2012-03-08 (×2): qty 125

## 2012-03-08 MED ORDER — IBUPROFEN 600 MG PO TABS
600.0000 mg | ORAL_TABLET | Freq: Four times a day (QID) | ORAL | Status: DC | PRN
  Administered 2012-03-09: 600 mg via ORAL
  Filled 2012-03-08: qty 1

## 2012-03-08 MED ORDER — PHENYLEPHRINE 40 MCG/ML (10ML) SYRINGE FOR IV PUSH (FOR BLOOD PRESSURE SUPPORT): 80.0000 ug | PREFILLED_SYRINGE | INTRAVENOUS | Status: DC | PRN

## 2012-03-08 MED ORDER — EPHEDRINE 5 MG/ML INJ: 10.0000 mg | INTRAVENOUS | Status: DC | PRN

## 2012-03-08 MED ORDER — OXYTOCIN 40 UNITS IN LACTATED RINGERS INFUSION - SIMPLE MED
62.5000 mL/h | INTRAVENOUS | Status: DC
  Administered 2012-03-08: 62.5 mL/h via INTRAVENOUS

## 2012-03-08 MED ORDER — LIDOCAINE HCL (PF) 1 % IJ SOLN
INTRAMUSCULAR | Status: DC | PRN
  Administered 2012-03-08 (×2): 5 mL

## 2012-03-08 MED ORDER — CITRIC ACID-SODIUM CITRATE 334-500 MG/5ML PO SOLN: 30.0000 mL | ORAL | Status: DC | PRN

## 2012-03-08 MED ORDER — LACTATED RINGERS IV SOLN: 500.0000 mL | Freq: Once | INTRAVENOUS | Status: DC

## 2012-03-08 MED ORDER — OXYCODONE-ACETAMINOPHEN 5-325 MG PO TABS: 1.0000 | ORAL_TABLET | ORAL | Status: DC | PRN

## 2012-03-08 MED ORDER — ONDANSETRON HCL 4 MG/2ML IJ SOLN: 4.0000 mg | Freq: Four times a day (QID) | INTRAMUSCULAR | Status: DC | PRN

## 2012-03-08 MED ORDER — OXYTOCIN 40 UNITS IN LACTATED RINGERS INFUSION - SIMPLE MED
1.0000 m[IU]/min | INTRAVENOUS | Status: DC
  Administered 2012-03-08: 2 m[IU]/min via INTRAVENOUS
  Filled 2012-03-08: qty 1000

## 2012-03-08 MED ORDER — LACTATED RINGERS IV SOLN
500.0000 mL | INTRAVENOUS | Status: DC | PRN
  Administered 2012-03-08: 500 mL via INTRAVENOUS

## 2012-03-08 MED ORDER — PHENYLEPHRINE 40 MCG/ML (10ML) SYRINGE FOR IV PUSH (FOR BLOOD PRESSURE SUPPORT)
80.0000 ug | PREFILLED_SYRINGE | INTRAVENOUS | Status: DC | PRN
  Filled 2012-03-08: qty 5

## 2012-03-08 MED ORDER — DIPHENHYDRAMINE HCL 50 MG/ML IJ SOLN: 12.5000 mg | INTRAMUSCULAR | Status: DC | PRN

## 2012-03-08 NOTE — H&P (Signed)
Grace Graves is a 42 y.o. female at [redacted] wks ega admitted by Dr. Dion Body for induction secondary to mild preeclampsia. She had bp's in the office last week of 140's over 90's . Her 24 hour for protein showed 400+ mg of protein. She denies headache visual changes or ruq pain.   History OB History    Grav Para Term Preterm Abortions TAB SAB Ect Mult Living   4 2 2  0 1 0 1 0 0 2     Past Medical History  Diagnosis Date  . No pertinent past medical history   . AMA (advanced maternal age) multigravida 35+   . Depression     pp depression x2  . H/O varicella   . Abnormal Pap smear   . Complication of anesthesia     woke up in middle of anesthesia   Past Surgical History  Procedure Date  . Tonsillectomy   . Laparoscopy   . Colposcopy   . Eye surgery     L eye   Family History: family history includes Arthritis in her mother; COPD in her mother; Cancer in her mother; Depression in her father, mother, and sister; Diabetes in her mother; Heart disease in her mother; Hyperlipidemia in her mother; Hypertension in her father and mother; Hypothyroidism in her sister; and Kidney disease in her sister. Social History:  reports that she has never smoked. She has never used smokeless tobacco. She reports that she does not drink alcohol or use illicit drugs.   Prenatal Transfer Tool  Maternal Diabetes: No Genetic Screening: Normal Maternal Ultrasounds/Referrals: Normal Fetal Ultrasounds or other Referrals:  None Maternal Substance Abuse:  No Significant Maternal Medications:  None Significant Maternal Lab Results:  Lab values include: Group B Strep negative Other Comments:  None  Review of Systems  All other systems reviewed and are negative.    Dilation: 5 Effacement (%): 50 Station: -3 Exam by:: Dherr rn Blood pressure 127/64, pulse 103, temperature 98.7 F (37.1 C), temperature source Oral, resp. rate 18, height 5\' 9"  (1.753 m), weight 128.368 kg (283 lb), last menstrual period  06/23/2011, SpO2 99.00%. Maternal Exam:  Uterine Assessment: Contraction strength is moderate.  Contraction frequency is regular.   Abdomen: Patient reports no abdominal tenderness. Fetal presentation: vertex  Introitus: Normal vulva. Normal vagina.  Pelvis: adequate for delivery.   Cervix: Cervix evaluated by digital exam.     Fetal Exam Fetal Monitor Review: Mode: fetoscope.   Baseline rate: 140's .  Variability: moderate (6-25 bpm).   Pattern: accelerations present and no decelerations.    Fetal State Assessment: Category I - tracings are normal.     Physical Exam  Constitutional: She is oriented to person, place, and time. She appears well-developed and well-nourished.  Neck: Normal range of motion.  Cardiovascular: Normal rate and regular rhythm.   Respiratory: Effort normal and breath sounds normal.  Genitourinary: Vagina normal.  Musculoskeletal: Normal range of motion. She exhibits edema.  Neurological: She is alert and oriented to person, place, and time.    Prenatal labs: ABO, Rh: --/--/A POS (01/20 0735) Antibody: NEG (01/20 0735) Rubella: Immune (06/27 0000) RPR: NON REACTIVE (01/20 0735)  HBsAg: Negative (06/27 0000)  HIV: Non-reactive (06/27 0000)  GBS: Negative, Negative, Negative (01/06 0000)   Assessment/Plan: 37 wks with mild preeclampsia for induction.  Pitocin.  GBS negative  Anticipate svd    Ashad Fawbush J. 03/08/2012, 5:29 PM

## 2012-03-08 NOTE — Progress Notes (Signed)
Grace Graves is a 42 y.o. [id number] at [redacted]w[redacted]d  Admitted for induction due to mild preeclampsia   Subjective: Pain controlled with epidural no headache blurred vision or ruq pain    Objective: BP 135/66  Pulse 92  Temp 98.7 F (37.1 C) (Oral)  Resp 18  Ht 5\' 9"  (1.753 m)  Wt 128.368 kg (283 lb)  BMI 41.79 kg/m2  SpO2 99%  LMP 06/23/2011      FHT:  FHR: 140 bpm, variability: moderate,  accelerations:  Present,  decelerations:  Absent UC:   regular, every 2 minutes SVE:   6/50/-3 ballotable  Labs: Lab Results  Component Value Date   WBC 11.5* 03/08/2012   HGB 11.1* 03/08/2012   HCT 34.4* 03/08/2012   MCV 87.5 03/08/2012   PLT 254 03/08/2012    Assessment / Plan: Induction of labor due to preeclampsia,  progressing well on pitocin  Labor: Progressing on Pitocin, will continue to increase then AROM Preeclampsia:  labs stable Fetal Wellbeing:  Category I Pain Control:  Epidural I/D:  n/a Anticipated MOD:  NSVD  Dr. Christell Constant covering this pm   Grace Graves J. 03/08/2012, 5:50 PM

## 2012-03-08 NOTE — Progress Notes (Signed)
Grace Graves is a 42 y.o. [id number] at [redacted]w[redacted]d byadmitted for induction of labor due to Pre-eclamptic toxemia of pregnancy..  Subjective:   Objective: BP 147/86  Pulse 100  Temp 98.7 F (37.1 C) (Oral)  Resp 18  Ht 5\' 9"  (1.753 m)  Wt 128.368 kg (283 lb)  BMI 41.79 kg/m2  SpO2 99%  LMP 06/23/2011      FHT:  FHR: 140s bpm, variability: moderate,  accelerations:  Present,  decelerations:  Absent UC:   irregular, every 3-6 minutes SVE:   Dilation: 6 Effacement (%): 50 Station: Ballotable Exam by:: a. harper, rnc-ob 6/70/-3/vtx / AROM clear fluid and had to hold hand in place to make sure the head stayed well applied Labs: Lab Results  Component Value Date   WBC 11.5* 03/08/2012   HGB 11.1* 03/08/2012   HCT 34.4* 03/08/2012   MCV 87.5 03/08/2012   PLT 254 03/08/2012    Assessment / Plan: Induction of labor due to preeclampsia,  progressing well on pitocin  Labor: Progressing on Pitocin, will continue to increase then AROM - done Preeclampsia:  no signs or symptoms of toxicity Fetal Wellbeing:  Category I Pain Control:  Epidural I/D:  n/a Anticipated MOD:  NSVD  Grace Graves. 03/08/2012, 9:41 PM

## 2012-03-08 NOTE — Op Note (Signed)
The pt delivered ROP at 2310 with APGARS 8,9 at 1,5 min respectively viable boy with no problems.  The baby was placed on maternal abdomen and the cord cut by the father and the cord blood obtained.  Placenta delivered within approximately 5 min and appeared intact.  Total EBL 300 ml  No lacerations and fundus firm   Orders in computer for routine pp care

## 2012-03-08 NOTE — Anesthesia Procedure Notes (Signed)

## 2012-03-08 NOTE — Anesthesia Preprocedure Evaluation (Addendum)
Anesthesia Evaluation  Patient identified by MRN, date of birth, ID band Patient awake    Reviewed: Allergy & Precautions, H&P , Patient's Chart, lab work & pertinent test results  History of Anesthesia Complications (+) AWARENESS UNDER ANESTHESIA  Airway Mallampati: III TM Distance: >3 FB Neck ROM: full    Dental No notable dental hx.    Pulmonary neg pulmonary ROS,  breath sounds clear to auscultation  Pulmonary exam normal       Cardiovascular hypertension, negative cardio ROS  Rhythm:regular Rate:Normal     Neuro/Psych negative neurological ROS  negative psych ROS   GI/Hepatic negative GI ROS, Neg liver ROS,   Endo/Other  negative endocrine ROSMorbid obesity  Renal/GU negative Renal ROS     Musculoskeletal   Abdominal   Peds  Hematology negative hematology ROS (+)   Anesthesia Other Findings No pertinent past medical history     AMA (advanced maternal age) multigravida 35+        Depression   pp depression x2 H/O varicella        Abnormal Pap smear     Complication of anesthesia   woke up in middle of anesthesia    Reproductive/Obstetrics (+) Pregnancy                          Anesthesia Physical Anesthesia Plan  ASA: III  Anesthesia Plan: Epidural   Post-op Pain Management:    Induction:   Airway Management Planned:   Additional Equipment:   Intra-op Plan:   Post-operative Plan:   Informed Consent: I have reviewed the patients History and Physical, chart, labs and discussed the procedure including the risks, benefits and alternatives for the proposed anesthesia with the patient or authorized representative who has indicated his/her understanding and acceptance.     Plan Discussed with:   Anesthesia Plan Comments:         Anesthesia Quick Evaluation

## 2012-03-09 ENCOUNTER — Encounter (HOSPITAL_COMMUNITY): Payer: Self-pay

## 2012-03-09 LAB — CBC
HCT: 32 % — ABNORMAL LOW (ref 36.0–46.0)
MCV: 87.4 fL (ref 78.0–100.0)
Platelets: 229 10*3/uL (ref 150–400)
RBC: 3.66 MIL/uL — ABNORMAL LOW (ref 3.87–5.11)
WBC: 12.6 10*3/uL — ABNORMAL HIGH (ref 4.0–10.5)

## 2012-03-09 MED ORDER — ONDANSETRON HCL 4 MG/2ML IJ SOLN: 4.0000 mg | INTRAMUSCULAR | Status: DC | PRN

## 2012-03-09 MED ORDER — TETANUS-DIPHTH-ACELL PERTUSSIS 5-2.5-18.5 LF-MCG/0.5 IM SUSP: 0.5000 mL | Freq: Once | INTRAMUSCULAR | Status: DC

## 2012-03-09 MED ORDER — SIMETHICONE 80 MG PO CHEW
80.0000 mg | CHEWABLE_TABLET | ORAL | Status: DC | PRN
  Administered 2012-03-09: 80 mg via ORAL

## 2012-03-09 MED ORDER — IBUPROFEN 600 MG PO TABS
600.0000 mg | ORAL_TABLET | Freq: Four times a day (QID) | ORAL | Status: DC
  Administered 2012-03-09 – 2012-03-10 (×5): 600 mg via ORAL
  Filled 2012-03-09 (×5): qty 1

## 2012-03-09 MED ORDER — ZOLPIDEM TARTRATE 5 MG PO TABS: 5.0000 mg | ORAL_TABLET | Freq: Every evening | ORAL | Status: DC | PRN

## 2012-03-09 MED ORDER — OXYCODONE-ACETAMINOPHEN 5-325 MG PO TABS: 1.0000 | ORAL_TABLET | ORAL | Status: DC | PRN

## 2012-03-09 MED ORDER — DIBUCAINE 1 % RE OINT: 1.0000 "application " | TOPICAL_OINTMENT | RECTAL | Status: DC | PRN

## 2012-03-09 MED ORDER — DIPHENHYDRAMINE HCL 25 MG PO CAPS: 25.0000 mg | ORAL_CAPSULE | Freq: Four times a day (QID) | ORAL | Status: DC | PRN

## 2012-03-09 MED ORDER — WITCH HAZEL-GLYCERIN EX PADS: 1.0000 "application " | MEDICATED_PAD | CUTANEOUS | Status: DC | PRN

## 2012-03-09 MED ORDER — PRENATAL MULTIVITAMIN CH
1.0000 | ORAL_TABLET | Freq: Every day | ORAL | Status: DC
  Administered 2012-03-09 – 2012-03-10 (×2): 1 via ORAL
  Filled 2012-03-09 (×2): qty 1

## 2012-03-09 MED ORDER — ONDANSETRON HCL 4 MG PO TABS: 4.0000 mg | ORAL_TABLET | ORAL | Status: DC | PRN

## 2012-03-09 MED ORDER — SENNOSIDES-DOCUSATE SODIUM 8.6-50 MG PO TABS
2.0000 | ORAL_TABLET | Freq: Every day | ORAL | Status: DC
  Administered 2012-03-09: 2 via ORAL

## 2012-03-09 MED ORDER — LANOLIN HYDROUS EX OINT: TOPICAL_OINTMENT | CUTANEOUS | Status: DC | PRN

## 2012-03-09 MED ORDER — BENZOCAINE-MENTHOL 20-0.5 % EX AERO: 1.0000 "application " | INHALATION_SPRAY | CUTANEOUS | Status: DC | PRN

## 2012-03-09 NOTE — Progress Notes (Signed)
Post Partum Day 1 s/p vaginal delivery  Subjective: no complaints, up ad lib and tolerating PO  Objective: Blood pressure 114/73, pulse 81, temperature 97.9 F (36.6 C), temperature source Oral, resp. rate 18, height 5\' 9"  (1.753 m), weight 128.368 kg (283 lb), last menstrual period 06/23/2011, SpO2 99.00%, unknown if currently breastfeeding.  Physical Exam:  General: alert and cooperative Lochia: appropriate Uterine Fundus: firm Incision: na DVT Evaluation: No evidence of DVT seen on physical exam.   Basename 03/09/12 0540 03/08/12 1300  HGB 10.3* 11.1*  HCT 32.0* 34.4*    Assessment/Plan: Plan for discharge tomorrow and Circumcision prior to discharge   LOS: 1 day   Britanny Marksberry J. 03/09/2012, 12:24 PM

## 2012-03-09 NOTE — Anesthesia Postprocedure Evaluation (Signed)
Anesthesia Post Note  Patient: Grace Graves  Procedure(s) Performed: * No procedures listed *  Anesthesia type: Epidural  Patient location: Mother/Baby  Post pain: Pain level controlled  Post assessment: Post-op Vital signs reviewed  Last Vitals:  Filed Vitals:   03/09/12 0640  BP: 114/73  Pulse: 81  Temp: 36.6 C  Resp: 18    Post vital signs: Reviewed  Level of consciousness: awake  Complications: No apparent anesthesia complications

## 2012-03-10 MED ORDER — IBUPROFEN 600 MG PO TABS: 600.0000 mg | ORAL_TABLET | Freq: Four times a day (QID) | ORAL | Status: DC

## 2012-03-10 NOTE — Progress Notes (Signed)
Post Partum Day 2 Subjective: no complaints.  Bleeding minimal.  Denies headaches or visual changes.  Desires circumcision.    Objective: Blood pressure 117/74, pulse 80, temperature 97.7 F (36.5 C), temperature source Oral, resp. rate 20, height 5\' 9"  (1.753 m), weight 128.368 kg (283 lb), last menstrual period 06/23/2011, SpO2 99.00%, unknown if currently breastfeeding.  Physical Exam:  General: alert, cooperative and no distress Lochia: appropriate Uterine Fundus: firm Incision: n/a DVT Evaluation: No evidence of DVT seen on physical exam. Calf/Ankle edema is present.   Basename 03/09/12 0540 03/08/12 1300  HGB 10.3* 11.1*  HCT 32.0* 34.4*    Assessment/Plan: Discharge home BP normal.  No s/sxs of worsening Preeclampsia. Discharge home.  PP depression precautions given. F/u in 2 weeks.   LOS: 2 days   Marsa Matteo 03/10/2012, 9:54 AM

## 2012-03-10 NOTE — Discharge Summary (Signed)
Obstetric Discharge Summary Reason for Admission: induction of labor and due to preeclampsia Prenatal Procedures: NST, Preeclampsia and ultrasound, Genetic screening Intrapartum Procedures: spontaneous vaginal delivery Postpartum Procedures: none  BP was normal to mildly elevated postpartum. Complications-Operative and Postpartum: none Hemoglobin  Date Value Range Status  03/09/2012 10.3* 12.0 - 15.0 g/dL Final     HCT  Date Value Range Status  03/09/2012 32.0* 36.0 - 46.0 % Final    Physical Exam:  WNl  Discharge Diagnoses: Preelampsia and Delivered  Discharge Information: Date: 03/10/2012 Activity: pelvic rest Diet: routine Medications: PNV and Ibuprofen Condition: improved Instructions: See discharge instructions. Discharge to: home Follow-up Information    Follow up with Geryl Rankins, MD. Schedule an appointment as soon as possible for a visit in 1 week. (BP check)    Contact information:   [address] [place] [address] [phone]       Follow up with Geryl Rankins, MD. Schedule an appointment as soon as possible for a visit in 6 weeks. (PP check)    Contact information:   [address] [place] [address] [phone]          Newborn Data: Live born female  Birth Weight: 6 lb 5.9 oz (2890 g) APGAR: 8, 9  Home with mother. Circ done day of discharge.  Geryl Rankins 03/10/2012, 9:57 AM

## 2012-03-10 NOTE — Progress Notes (Signed)
Pt discharged before CSW could assess PP depression.

## 2012-03-16 ENCOUNTER — Ambulatory Visit (HOSPITAL_COMMUNITY): Admit: 2012-03-16 | Payer: 59

## 2012-06-14 ENCOUNTER — Other Ambulatory Visit: Payer: Self-pay | Admitting: Obstetrics and Gynecology

## 2012-06-14 DIAGNOSIS — Z1231 Encounter for screening mammogram for malignant neoplasm of breast: Secondary | ICD-10-CM

## 2012-06-18 ENCOUNTER — Ambulatory Visit (HOSPITAL_COMMUNITY)
Admission: RE | Admit: 2012-06-18 | Discharge: 2012-06-18 | Disposition: A | Discharge status: Home / Self Care | Payer: 59 | Source: Ambulatory Visit | Attending: Obstetrics and Gynecology | Admitting: Obstetrics and Gynecology

## 2012-06-18 DIAGNOSIS — Z1231 Encounter for screening mammogram for malignant neoplasm of breast: Secondary | ICD-10-CM

## 2012-08-25 ENCOUNTER — Other Ambulatory Visit: Payer: Self-pay | Admitting: Obstetrics and Gynecology

## 2012-08-25 ENCOUNTER — Other Ambulatory Visit (HOSPITAL_COMMUNITY)
Admission: RE | Admit: 2012-08-25 | Discharge: 2012-08-25 | Disposition: A | Discharge status: Home / Self Care | Payer: 59 | Source: Ambulatory Visit | Attending: Obstetrics and Gynecology | Admitting: Obstetrics and Gynecology

## 2012-08-25 DIAGNOSIS — Z01419 Encounter for gynecological examination (general) (routine) without abnormal findings: Secondary | ICD-10-CM | POA: Insufficient documentation

## 2012-08-25 DIAGNOSIS — Z1151 Encounter for screening for human papillomavirus (HPV): Secondary | ICD-10-CM | POA: Insufficient documentation

## 2012-09-20 ENCOUNTER — Telehealth: Payer: Self-pay | Admitting: *Deleted

## 2012-09-20 ENCOUNTER — Encounter: Payer: Self-pay | Admitting: *Deleted

## 2012-09-20 NOTE — Telephone Encounter (Signed)
Pap recall review. Pap letter mailed 08-16-12.   Per EPIC note, patient was seen by Dr Richardson Dopp and pap has been done 08-2012. Out of recall. Paper chart to your office.

## 2013-02-28 ENCOUNTER — Other Ambulatory Visit (HOSPITAL_COMMUNITY): Payer: Self-pay | Admitting: Family Medicine

## 2013-02-28 DIAGNOSIS — R109 Unspecified abdominal pain: Secondary | ICD-10-CM

## 2013-03-03 ENCOUNTER — Ambulatory Visit (HOSPITAL_COMMUNITY)
Admission: RE | Admit: 2013-03-03 | Discharge: 2013-03-03 | Disposition: A | Discharge status: Home / Self Care | Payer: 59 | Source: Ambulatory Visit | Attending: Family Medicine | Admitting: Family Medicine

## 2013-03-03 DIAGNOSIS — R109 Unspecified abdominal pain: Secondary | ICD-10-CM

## 2013-03-03 DIAGNOSIS — R1013 Epigastric pain: Secondary | ICD-10-CM | POA: Insufficient documentation

## 2013-03-03 DIAGNOSIS — K8020 Calculus of gallbladder without cholecystitis without obstruction: Secondary | ICD-10-CM | POA: Insufficient documentation

## 2013-03-03 DIAGNOSIS — R079 Chest pain, unspecified: Secondary | ICD-10-CM | POA: Insufficient documentation

## 2013-03-31 ENCOUNTER — Ambulatory Visit (INDEPENDENT_AMBULATORY_CARE_PROVIDER_SITE_OTHER): Payer: Commercial Managed Care - PPO | Admitting: General Surgery

## 2013-03-31 ENCOUNTER — Encounter (INDEPENDENT_AMBULATORY_CARE_PROVIDER_SITE_OTHER): Payer: Self-pay | Admitting: General Surgery

## 2013-03-31 VITALS — BP 130/94 | HR 80 | Temp 98.2°F | Resp 14 | Ht 68.0 in | Wt 271.2 lb

## 2013-03-31 DIAGNOSIS — K8020 Calculus of gallbladder without cholecystitis without obstruction: Secondary | ICD-10-CM

## 2013-03-31 NOTE — Patient Instructions (Signed)
Strict lowfat diet as we discussed.   [facility] ______[facility] LAPAROSCOPIC SURGERY: POST OP INSTRUCTIONS Always review your discharge instruction sheet given to you by the facility where your surgery was performed. IF YOU HAVE DISABILITY OR FAMILY LEAVE FORMS, YOU MUST BRING THEM TO THE OFFICE FOR PROCESSING.   DO NOT GIVE THEM TO YOUR DOCTOR.  1. A prescription for pain medication may be given to you upon discharge.  Take your pain medication as prescribed, if needed.  If narcotic pain medicine is not needed, then you may take acetaminophen (Tylenol) or ibuprofen (Advil) as needed. 2. Take your usually prescribed medications unless otherwise directed. 3. If you need a refill on your pain medication, please contact your pharmacy.  They will contact our office to request authorization. Prescriptions will not be filled after 5pm or on week-ends. 4. You should follow a light diet the first few days after arrival home, such as soup and crackers, etc.  Be sure to include lots of fluids daily. 5. Most patients will experience some swelling and bruising in the area of the incisions.  Ice packs will help.  Swelling and bruising can take several days to resolve.  6. It is common to experience some constipation if taking pain medication after surgery.  Increasing fluid intake and taking a stool softener (such as Colace) will usually help or prevent this problem from occurring.  A mild laxative (Milk of Magnesia or Miralax) should be taken according to package instructions if there are no bowel movements after 48 hours. 7. Unless discharge instructions indicate otherwise, you may remove your bandages 24-48 hours after surgery, and you may shower at that time.  You may have steri-strips (small skin tapes) in place directly over the incision.  These strips should be left on the skin for 7-10 days.  If your surgeon used skin glue on the incision, you may shower in 24 hours.  The glue will flake off  over the next 2-3 weeks.  Any sutures or staples will be removed at the office during your follow-up visit. 8. ACTIVITIES:  You may resume regular (light) daily activities beginning the next day-such as daily self-care, walking, climbing stairs-gradually increasing activities as tolerated.  You may have sexual intercourse when it is comfortable.  Refrain from any heavy lifting or straining until approved by your doctor. a. You may drive when you are no longer taking prescription pain medication, you can comfortably wear a seatbelt, and you can safely maneuver your car and apply brakes. b. RETURN TO WORK:  __________________________________________________________ 9. You should see your doctor in the office for a follow-up appointment approximately 2-3 weeks after your surgery.  Make sure that you call for this appointment within a day or two after you arrive home to insure a convenient appointment time. 10. OTHER INSTRUCTIONS: __________________________________________________________________________________________________________________________ __________________________________________________________________________________________________________________________ WHEN TO CALL YOUR DOCTOR: 1. Fever over 101.0 2. Inability to urinate 3. Continued bleeding from incision. 4. Increased pain, redness, or drainage from the incision. 5. Increasing abdominal pain  The clinic staff is available to answer your questions during regular business hours.  Please don't hesitate to call and ask to speak to one of the nurses for clinical concerns.  If you have a medical emergency, go to the nearest emergency room or call 911.  A surgeon from [facility] is always on call at the hospital. [address], [place], [place], [place]  [address] ? [address], [place],    [phone] ?  [phone] ? FAX [phone] Web site: [email or web address]

## 2013-03-31 NOTE — Progress Notes (Signed)
Patient ID: Grace Meriweather, female   DOB: Dec 11, 1970, 43 y.o.   MRN: [id number]  Chief Complaint  Patient presents with  . New Evaluation    eval gallstones    HPI Nayanna Seaborn is a 43 y.o. female.   HPI  She is referred by Dr. London Pepper because of epigastric pain and gallstones. 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 She subsequently had severe epigastric pain that radiated to her back. She went to the emergency department and had a thorough evaluation. She was told that she had gallstones at the time. Last Saturday and Sunday she had small episodes after eating some fatty food. This week she has been better. There is a strong family history of gallbladder disease.  Ultrasound on 03/03/2013 demonstrated multiple gallstones. The largest is 1.7 cm. The diameter of the common bile duct was normal at 5 mm. Heterogeneous liver parenchyma is noted.    Past Medical History  Diagnosis Date  . No pertinent past medical history   . AMA (advanced maternal age) multigravida 19+   . Depression     pp depression x2  . H/O varicella   . Abnormal Pap smear   . Complication of anesthesia     woke up in middle of anesthesia    Past Surgical History  Procedure Laterality Date  . Tonsillectomy    . Laparoscopy    . Colposcopy    . Eye surgery      L eye    Family History  Problem Relation Age of Onset  . Arthritis Mother   . COPD Mother   . Cancer Mother     colon and lung  . Depression Mother   . Diabetes Mother   . Heart disease Mother   . Hyperlipidemia Mother   . Hypertension Mother   . Hypertension Father   . Depression Father   . Depression Sister   . Kidney disease Sister   . Hypothyroidism Sister     Social History History  Substance Use Topics  . Smoking status: Never Smoker   . Smokeless tobacco: Never Used  . Alcohol Use: No    No Known Allergies  Current Outpatient Prescriptions  Medication Sig Dispense Refill  . oxyCODONE-acetaminophen (PERCOCET)  10-325 MG per tablet Take 1 tablet by mouth every 4 (four) hours as needed for pain.      . pantoprazole (PROTONIX) 20 MG tablet Take 20 mg by mouth daily.      Marland Kitchen ibuprofen (ADVIL,MOTRIN) 600 MG tablet Take 1 tablet (600 mg total) by mouth every 6 (six) hours.  30 tablet     No current facility-administered medications for this visit.    Review of Systems Review of Systems  Constitutional: Negative.   HENT: Negative.   Respiratory: Negative.   Cardiovascular: Negative.   Gastrointestinal: Positive for abdominal pain.  Genitourinary: Negative.   Neurological: Negative.   Hematological: Negative.     Blood pressure 130/94, pulse 80, temperature 98.2 F (36.8 C), temperature source Oral, resp. rate 14, height 5\' 8"  (1.727 m), weight 271 lb 3.2 oz (123.016 kg), unknown if currently breastfeeding.  Physical Exam Physical Exam  Constitutional: No distress.  Overweight female.  HENT:  Head: Normocephalic and atraumatic.  Eyes: No scleral icterus.  Left pupil is irregular.  Cardiovascular: Normal rate and regular rhythm.   Pulmonary/Chest: Effort normal and breath sounds normal.  Abdominal: Soft. She exhibits no distension and no mass. There is no tenderness.  Musculoskeletal: She exhibits no  edema.  Neurological: She is alert.  Skin: Skin is warm and dry.  Psychiatric: She has a normal mood and affect. Her behavior is normal.    Data Reviewed Dr. Darien Ramus note.  [place] report.  Assessment    Symptomatic cholelithiasis.     Plan    Laparoscopic cholecystectomy with cholangiogram. Low-fat diet.  I have explained the procedure, risks, and aftercare of cholecystectomy.  Risks include but are not limited to bleeding, infection, wound problems, anesthesia, diarrhea, bile leak, injury to common bile duct/liver/intestine.  She seems to understand and agrees to proceed.         Edelyn Heidel J 03/31/2013, 4:50 PM

## 2013-04-07 ENCOUNTER — Encounter (HOSPITAL_COMMUNITY): Payer: Self-pay | Admitting: Pharmacy Technician

## 2013-04-12 ENCOUNTER — Encounter (HOSPITAL_COMMUNITY)
Admission: RE | Admit: 2013-04-12 | Discharge: 2013-04-12 | Disposition: A | Discharge status: Home / Self Care | Payer: 59 | Source: Ambulatory Visit | Attending: General Surgery | Admitting: General Surgery

## 2013-04-12 ENCOUNTER — Encounter (HOSPITAL_COMMUNITY): Payer: Self-pay

## 2013-04-12 DIAGNOSIS — Z01812 Encounter for preprocedural laboratory examination: Secondary | ICD-10-CM | POA: Insufficient documentation

## 2013-04-12 HISTORY — DX: Calculus of gallbladder without cholecystitis without obstruction: K80.20

## 2013-04-12 HISTORY — DX: Gastro-esophageal reflux disease without esophagitis: K21.9

## 2013-04-12 LAB — CBC
HCT: 38.8 % (ref 36.0–46.0)
Hemoglobin: 12.5 g/dL (ref 12.0–15.0)
MCH: 27.7 pg (ref 26.0–34.0)
MCHC: 32.2 g/dL (ref 30.0–36.0)
MCV: 85.8 fL (ref 78.0–100.0)
PLATELETS: 318 10*3/uL (ref 150–400)
RBC: 4.52 MIL/uL (ref 3.87–5.11)
RDW: 14.4 % (ref 11.5–15.5)
WBC: 6.2 10*3/uL (ref 4.0–10.5)

## 2013-04-12 LAB — COMPREHENSIVE METABOLIC PANEL
ALT: 13 U/L (ref 0–35)
AST: 16 U/L (ref 0–37)
Albumin: 3.8 g/dL (ref 3.5–5.2)
Alkaline Phosphatase: 85 U/L (ref 39–117)
BILIRUBIN TOTAL: 0.3 mg/dL (ref 0.3–1.2)
BUN: 13 mg/dL (ref 6–23)
CHLORIDE: 100 meq/L (ref 96–112)
CO2: 25 meq/L (ref 19–32)
CREATININE: 0.71 mg/dL (ref 0.50–1.10)
Calcium: 9.4 mg/dL (ref 8.4–10.5)
GFR calc Af Amer: >90 mL/min (ref >90)
Glucose, Bld: 92 mg/dL (ref 70–99)
Potassium: 4.1 mEq/L (ref 3.7–5.3)
Sodium: 138 mEq/L (ref 137–147)
Total Protein: 7.7 g/dL (ref 6.0–8.3)

## 2013-04-12 LAB — PROTIME-INR
INR: 0.97 (ref 0.00–1.49)
PROTHROMBIN TIME: 12.7 s (ref 11.6–15.2)

## 2013-04-12 LAB — PREGNANCY, URINE: Preg Test, Ur: NEGATIVE

## 2013-04-12 NOTE — Pre-Procedure Instructions (Signed)
EKG REPORT ON PT'S CHART FROM [facility] IN [place]- PT WAS TREATED THERE IN THE ER FOR GALLBLADDER ATTACK. CXR NOT NEEDED PREOP - PER ANESTHESIOLOGIST'S GUIDELINES.

## 2013-04-12 NOTE — Patient Instructions (Addendum)
   YOUR SURGERY IS SCHEDULED AT [facility]  ON: Monday 3/2  REPORT TO  SHORT STAY CENTER AT:  12:30 PM      PHONE # FOR SHORT STAY IS [phone]  DO NOT EAT ANYTHING AFTER MIDNIGHT THE NIGHT BEFORE YOUR SURGERY.  YOU MAY BRUSH YOUR TEETH,  NO FOOD, NO CHEWING GUM, NO MINTS, NO CANDIES, NO CHEWING TOBACCO. YOU MAY HAVE CLEAR LIQUIDS TO DRINK FROM MIDNIGHT UNTIL 8:30 AM DAY OF SURGERY - LIKE WATER, TEA.   NOTHING TO DRINK AFTER 8:30 AM DAY OF SURGERY.  PLEASE TAKE THE FOLLOWING MEDICATIONS THE AM OF YOUR SURGERY WITH A FEW SIPS OF WATER:  PAIN PILL IF NEEDED.   DO NOT BRING VALUABLES, MONEY, CREDIT CARDS.  DO NOT WEAR JEWELRY, MAKE-UP, NAIL POLISH AND NO METAL PINS OR CLIPS IN YOUR HAIR. CONTACT LENS, DENTURES / PARTIALS, GLASSES SHOULD NOT BE WORN TO SURGERY AND IN MOST CASES-HEARING AIDS WILL NEED TO BE REMOVED.  BRING YOUR GLASSES CASE, ANY EQUIPMENT NEEDED FOR YOUR CONTACT LENS. FOR PATIENTS ADMITTED TO THE HOSPITAL--CHECK OUT TIME THE DAY OF DISCHARGE IS 11:00 AM.  ALL INPATIENT ROOMS ARE PRIVATE - WITH BATHROOM, TELEPHONE, TELEVISION AND WIFI INTERNET.  IF YOU ARE BEING DISCHARGED THE SAME DAY OF YOUR SURGERY--YOU CAN NOT DRIVE YOURSELF HOME--AND SHOULD NOT GO HOME ALONE BY TAXI OR BUS.  NO DRIVING OR OPERATING MACHINERY, OR MAKING LEGAL DECISIONS FOR 24 HOURS FOLLOWING ANESTHESIA / PAIN MEDICATIONS.  PLEASE MAKE ARRANGEMENTS FOR SOMEONE TO BE WITH YOU AT HOME THE FIRST 24 HOURS AFTER SURGERY. RESPONSIBLE DRIVER'S NAME / PHONE                                                 JARVIN VINCENT - [address]  FACT SHEETS THAT YOU WERE GIVEN:  BLOOD TRANSFUSION INFORMATION  FAILURE TO FOLLOW THESE INSTRUCTIONS MAY RESULT IN THE CANCELLATION OF YOUR SURGERY. PLEASE BE AWARE THAT YOU MAY NEED ADDITIONAL BLOOD DRAWN DAY OF YOUR SURGERY  PATIENT SIGNATURE_________________________________  PT NOTIFIED HER SURGERY  TIME ON Monday 3/2 WAS MOVED UP TO 1:00 PM AND SHE SHOULD ARRIVE TO SHORT STAY CENTER BY 11:00 AM AND THE CLEAR LIQUIDS TO DRINK UNTIL 7:00 AM.

## 2013-04-17 MED ORDER — DEXTROSE 5 % IV SOLN
3.0000 g | INTRAVENOUS | Status: AC
  Administered 2013-04-18: 3 g via INTRAVENOUS
  Filled 2013-04-17: qty 3000

## 2013-04-18 ENCOUNTER — Encounter (HOSPITAL_COMMUNITY): Payer: Self-pay | Admitting: *Deleted

## 2013-04-18 ENCOUNTER — Encounter (HOSPITAL_COMMUNITY)
Admission: RE | Disposition: A | Discharge status: Home / Self Care | Payer: Self-pay | Source: Ambulatory Visit | Attending: General Surgery

## 2013-04-18 ENCOUNTER — Encounter (HOSPITAL_COMMUNITY): Payer: 59 | Admitting: Anesthesiology

## 2013-04-18 ENCOUNTER — Ambulatory Visit (HOSPITAL_COMMUNITY): Payer: 59

## 2013-04-18 ENCOUNTER — Ambulatory Visit (HOSPITAL_COMMUNITY): Payer: 59 | Admitting: Anesthesiology

## 2013-04-18 ENCOUNTER — Ambulatory Visit (HOSPITAL_COMMUNITY)
Admission: RE | Admit: 2013-04-18 | Discharge: 2013-04-18 | Disposition: A | Discharge status: Home / Self Care | Payer: 59 | Source: Ambulatory Visit | Attending: General Surgery | Admitting: General Surgery

## 2013-04-18 DIAGNOSIS — K219 Gastro-esophageal reflux disease without esophagitis: Secondary | ICD-10-CM | POA: Insufficient documentation

## 2013-04-18 DIAGNOSIS — K8010 Calculus of gallbladder with chronic cholecystitis without obstruction: Secondary | ICD-10-CM

## 2013-04-18 DIAGNOSIS — Z79899 Other long term (current) drug therapy: Secondary | ICD-10-CM | POA: Insufficient documentation

## 2013-04-18 HISTORY — PX: CHOLECYSTECTOMY: SHX55

## 2013-04-18 SURGERY — LAPAROSCOPIC CHOLECYSTECTOMY WITH INTRAOPERATIVE CHOLANGIOGRAM
Anesthesia: General | Site: Abdomen

## 2013-04-18 MED ORDER — IOHEXOL 300 MG/ML  SOLN
INTRAMUSCULAR | Status: DC | PRN
  Administered 2013-04-18: 1.5 mL via INTRAVENOUS

## 2013-04-18 MED ORDER — ROCURONIUM BROMIDE 100 MG/10ML IV SOLN
INTRAVENOUS | Status: AC
  Filled 2013-04-18: qty 1

## 2013-04-18 MED ORDER — FENTANYL CITRATE 0.05 MG/ML IJ SOLN
INTRAMUSCULAR | Status: DC | PRN
  Administered 2013-04-18: 50 ug via INTRAVENOUS
  Administered 2013-04-18: 100 ug via INTRAVENOUS
  Administered 2013-04-18 (×2): 50 ug via INTRAVENOUS

## 2013-04-18 MED ORDER — PROPOFOL 10 MG/ML IV BOLUS
INTRAVENOUS | Status: DC | PRN
  Administered 2013-04-18: 150 mg via INTRAVENOUS

## 2013-04-18 MED ORDER — LACTATED RINGERS IV SOLN
INTRAVENOUS | Status: DC
  Administered 2013-04-18: 14:00:00 via INTRAVENOUS
  Administered 2013-04-18: 1000 mL via INTRAVENOUS

## 2013-04-18 MED ORDER — MIDAZOLAM HCL 5 MG/5ML IJ SOLN
INTRAMUSCULAR | Status: DC | PRN
  Administered 2013-04-18: 2 mg via INTRAVENOUS

## 2013-04-18 MED ORDER — MIDAZOLAM HCL 2 MG/2ML IJ SOLN
INTRAMUSCULAR | Status: AC
  Filled 2013-04-18: qty 2

## 2013-04-18 MED ORDER — FENTANYL CITRATE 0.05 MG/ML IJ SOLN
INTRAMUSCULAR | Status: AC
  Filled 2013-04-18: qty 5

## 2013-04-18 MED ORDER — HYDROMORPHONE HCL PF 1 MG/ML IJ SOLN
0.2500 mg | INTRAMUSCULAR | Status: DC | PRN
  Administered 2013-04-18: 0.25 mg via INTRAVENOUS

## 2013-04-18 MED ORDER — HYDROMORPHONE HCL PF 1 MG/ML IJ SOLN
INTRAMUSCULAR | Status: DC
Start: 2013-04-18 — End: 2013-04-18
  Filled 2013-04-18: qty 1

## 2013-04-18 MED ORDER — ROCURONIUM BROMIDE 100 MG/10ML IV SOLN
INTRAVENOUS | Status: DC | PRN
  Administered 2013-04-18: 30 mg via INTRAVENOUS

## 2013-04-18 MED ORDER — KETOROLAC TROMETHAMINE 30 MG/ML IJ SOLN
INTRAMUSCULAR | Status: DC | PRN
  Administered 2013-04-18: 30 mg via INTRAVENOUS

## 2013-04-18 MED ORDER — DEXAMETHASONE SODIUM PHOSPHATE 10 MG/ML IJ SOLN
INTRAMUSCULAR | Status: DC | PRN
  Administered 2013-04-18: 10 mg via INTRAVENOUS

## 2013-04-18 MED ORDER — PROMETHAZINE HCL 25 MG/ML IJ SOLN
6.2500 mg | INTRAMUSCULAR | Status: DC | PRN
  Administered 2013-04-18: 6.25 mg via INTRAVENOUS
  Filled 2013-04-18: qty 1

## 2013-04-18 MED ORDER — KETOROLAC TROMETHAMINE 30 MG/ML IJ SOLN
INTRAMUSCULAR | Status: AC
  Filled 2013-04-18: qty 1

## 2013-04-18 MED ORDER — ONDANSETRON HCL 4 MG/2ML IJ SOLN
INTRAMUSCULAR | Status: AC
  Filled 2013-04-18: qty 2

## 2013-04-18 MED ORDER — ONDANSETRON HCL 4 MG/2ML IJ SOLN
INTRAMUSCULAR | Status: DC | PRN
  Administered 2013-04-18: 4 mg via INTRAVENOUS

## 2013-04-18 MED ORDER — DEXAMETHASONE SODIUM PHOSPHATE 10 MG/ML IJ SOLN
INTRAMUSCULAR | Status: AC
  Filled 2013-04-18: qty 1

## 2013-04-18 MED ORDER — GLYCOPYRROLATE 0.2 MG/ML IJ SOLN
INTRAMUSCULAR | Status: AC
  Filled 2013-04-18: qty 3

## 2013-04-18 MED ORDER — NEOSTIGMINE METHYLSULFATE 1 MG/ML IJ SOLN
INTRAMUSCULAR | Status: AC
  Filled 2013-04-18: qty 10

## 2013-04-18 MED ORDER — SUCCINYLCHOLINE CHLORIDE 20 MG/ML IJ SOLN
INTRAMUSCULAR | Status: AC
  Filled 2013-04-18: qty 1

## 2013-04-18 MED ORDER — SUCCINYLCHOLINE CHLORIDE 20 MG/ML IJ SOLN
INTRAMUSCULAR | Status: DC | PRN
  Administered 2013-04-18: 100 mg via INTRAVENOUS

## 2013-04-18 MED ORDER — BUPIVACAINE HCL 0.5 % IJ SOLN
INTRAMUSCULAR | Status: DC | PRN
  Administered 2013-04-18: 20 mL

## 2013-04-18 MED ORDER — OXYCODONE HCL 5 MG PO TABS: 5.0000 mg | ORAL_TABLET | ORAL | Status: DC | PRN

## 2013-04-18 MED ORDER — LACTATED RINGERS IV SOLN
INTRAVENOUS | Status: DC | PRN
  Administered 2013-04-18: 2000 mL

## 2013-04-18 MED ORDER — GLYCOPYRROLATE 0.2 MG/ML IJ SOLN
INTRAMUSCULAR | Status: DC | PRN
  Administered 2013-04-18: 0.6 mg via INTRAVENOUS

## 2013-04-18 MED ORDER — BUPIVACAINE HCL (PF) 0.5 % IJ SOLN
INTRAMUSCULAR | Status: AC
  Filled 2013-04-18: qty 30

## 2013-04-18 MED ORDER — KETOROLAC TROMETHAMINE 30 MG/ML IJ SOLN: 15.0000 mg | Freq: Once | INTRAMUSCULAR | Status: DC | PRN

## 2013-04-18 MED ORDER — NEOSTIGMINE METHYLSULFATE 1 MG/ML IJ SOLN
INTRAMUSCULAR | Status: DC | PRN
  Administered 2013-04-18: 4 mg via INTRAVENOUS

## 2013-04-18 MED ORDER — PROPOFOL 10 MG/ML IV BOLUS
INTRAVENOUS | Status: AC
  Filled 2013-04-18: qty 20

## 2013-04-18 SURGICAL SUPPLY — 39 items
APPLIER CLIP 5 13 M/L LIGAMAX5 (MISCELLANEOUS) ×3
BENZOIN TINCTURE PRP APPL 2/3 (GAUZE/BANDAGES/DRESSINGS) ×3 IMPLANT
BLADE SURG ROTATE 9660 (MISCELLANEOUS) ×3 IMPLANT
CHLORAPREP W/TINT 26ML (MISCELLANEOUS) ×3 IMPLANT
CLIP APPLIE 5 13 M/L LIGAMAX5 (MISCELLANEOUS) ×1 IMPLANT
CLOSURE WOUND 1/2 X4 (GAUZE/BANDAGES/DRESSINGS) ×1
COVER MAYO STAND STRL (DRAPES) ×3 IMPLANT
DECANTER SPIKE VIAL GLASS SM (MISCELLANEOUS) IMPLANT
DRAPE C-ARM 42X120 X-RAY (DRAPES) ×3 IMPLANT
DRAPE LAPAROSCOPIC ABDOMINAL (DRAPES) ×3 IMPLANT
DRAPE UTILITY XL STRL (DRAPES) ×3 IMPLANT
DRSG TEGADERM 2-3/8X2-3/4 SM (GAUZE/BANDAGES/DRESSINGS) ×3 IMPLANT
DRSG TEGADERM 4X4.75 (GAUZE/BANDAGES/DRESSINGS) ×3 IMPLANT
ELECT REM PT RETURN 9FT ADLT (ELECTROSURGICAL) ×3
ELECTRODE REM PT RTRN 9FT ADLT (ELECTROSURGICAL) ×1 IMPLANT
ENDOLOOP SUT PDS II  0 18 (SUTURE)
ENDOLOOP SUT PDS II 0 18 (SUTURE) IMPLANT
GAUZE SPONGE 2X2 8PLY STRL LF (GAUZE/BANDAGES/DRESSINGS) ×1 IMPLANT
GLOVE ECLIPSE 8.0 STRL XLNG CF (GLOVE) ×3 IMPLANT
GLOVE INDICATOR 8.0 STRL GRN (GLOVE) ×3 IMPLANT
GOWN STRL REUS W/TWL XL LVL3 (GOWN DISPOSABLE) ×9 IMPLANT
HEMOSTAT SNOW SURGICEL 2X4 (HEMOSTASIS) IMPLANT
KIT BASIN OR (CUSTOM PROCEDURE TRAY) ×3 IMPLANT
POUCH SPECIMEN RETRIEVAL 10MM (ENDOMECHANICALS) ×3 IMPLANT
SCISSORS LAP 5X35 DISP (ENDOMECHANICALS) ×3 IMPLANT
SET CHOLANGIOGRAPH MIX (MISCELLANEOUS) ×3 IMPLANT
SET IRRIG TUBING LAPAROSCOPIC (IRRIGATION / IRRIGATOR) ×3 IMPLANT
SLEEVE XCEL OPT CAN 5 100 (ENDOMECHANICALS) ×6 IMPLANT
SOLUTION ANTI FOG 6CC (MISCELLANEOUS) ×3 IMPLANT
SPONGE GAUZE 2X2 STER 10/PKG (GAUZE/BANDAGES/DRESSINGS) ×2
STRIP CLOSURE SKIN 1/2X4 (GAUZE/BANDAGES/DRESSINGS) ×2 IMPLANT
SUT MNCRL AB 4-0 PS2 18 (SUTURE) ×3 IMPLANT
TOWEL OR 17X26 10 PK STRL BLUE (TOWEL DISPOSABLE) ×3 IMPLANT
TOWEL OR NON WOVEN STRL DISP B (DISPOSABLE) ×3 IMPLANT
TRAY LAP CHOLE (CUSTOM PROCEDURE TRAY) ×3 IMPLANT
TROCAR BLADELESS OPT 5 100 (ENDOMECHANICALS) ×9 IMPLANT
TROCAR XCEL BLUNT TIP 100MML (ENDOMECHANICALS) ×3 IMPLANT
TROCAR XCEL NON-BLD 11X100MML (ENDOMECHANICALS) IMPLANT
TUBING INSUFFLATION 10FT LAP (TUBING) ×3 IMPLANT

## 2013-04-18 NOTE — H&P (View-Only) (Signed)
Patient ID: Grace Graves, female   DOB: 08/12/1970, 42 y.o.   MRN: [id number]  Chief Complaint  Patient presents with  . New Evaluation    eval gallstones    HPI Grace Graves is a 42 y.o. female.   HPI  She is referred by Dr. Aaron Morrow because of epigastric pain and gallstones. 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 She subsequently had severe epigastric pain that radiated to her back. She went to the emergency department and had a thorough evaluation. She was told that she had gallstones at the time. Last Saturday and Sunday she had small episodes after eating some fatty food. This week she has been better. There is a strong family history of gallbladder disease.  Ultrasound on 03/03/2013 demonstrated multiple gallstones. The largest is 1.7 cm. The diameter of the common bile duct was normal at 5 mm. Heterogeneous liver parenchyma is noted.    Past Medical History  Diagnosis Date  . No pertinent past medical history   . AMA (advanced maternal age) multigravida 35+   . Depression     pp depression x2  . H/O varicella   . Abnormal Pap smear   . Complication of anesthesia     woke up in middle of anesthesia    Past Surgical History  Procedure Laterality Date  . Tonsillectomy    . Laparoscopy    . Colposcopy    . Eye surgery      L eye    Family History  Problem Relation Age of Onset  . Arthritis Mother   . COPD Mother   . Cancer Mother     colon and lung  . Depression Mother   . Diabetes Mother   . Heart disease Mother   . Hyperlipidemia Mother   . Hypertension Mother   . Hypertension Father   . Depression Father   . Depression Sister   . Kidney disease Sister   . Hypothyroidism Sister     Social History History  Substance Use Topics  . Smoking status: Never Smoker   . Smokeless tobacco: Never Used  . Alcohol Use: No    No Known Allergies  Current Outpatient Prescriptions  Medication Sig Dispense Refill  . oxyCODONE-acetaminophen (PERCOCET)  10-325 MG per tablet Take 1 tablet by mouth every 4 (four) hours as needed for pain.      . pantoprazole (PROTONIX) 20 MG tablet Take 20 mg by mouth daily.      . ibuprofen (ADVIL,MOTRIN) 600 MG tablet Take 1 tablet (600 mg total) by mouth every 6 (six) hours.  30 tablet     No current facility-administered medications for this visit.    Review of Systems Review of Systems  Constitutional: Negative.   HENT: Negative.   Respiratory: Negative.   Cardiovascular: Negative.   Gastrointestinal: Positive for abdominal pain.  Genitourinary: Negative.   Neurological: Negative.   Hematological: Negative.     Blood pressure 130/94, pulse 80, temperature 98.2 F (36.8 C), temperature source Oral, resp. rate 14, height 5' 8" (1.727 m), weight 271 lb 3.2 oz (123.016 kg), unknown if currently breastfeeding.  Physical Exam Physical Exam  Constitutional: No distress.  Overweight female.  HENT:  Head: Normocephalic and atraumatic.  Eyes: No scleral icterus.  Left pupil is irregular.  Cardiovascular: Normal rate and regular rhythm.   Pulmonary/Chest: Effort normal and breath sounds normal.  Abdominal: Soft. She exhibits no distension and no mass. There is no tenderness.  Musculoskeletal: She exhibits no   edema.  Neurological: She is alert.  Skin: Skin is warm and dry.  Psychiatric: She has a normal mood and affect. Her behavior is normal.    Data Reviewed Dr. Darien Ramus note.  [place] report.  Assessment    Symptomatic cholelithiasis.     Plan    Laparoscopic cholecystectomy with cholangiogram. Low-fat diet.  I have explained the procedure, risks, and aftercare of cholecystectomy.  Risks include but are not limited to bleeding, infection, wound problems, anesthesia, diarrhea, bile leak, injury to common bile duct/liver/intestine.  She seems to understand and agrees to proceed.         Lauren Modisette J 03/31/2013, 4:50 PM

## 2013-04-18 NOTE — Transfer of Care (Signed)
Immediate Anesthesia Transfer of Care Note  Patient: Grace Graves  Procedure(s) Performed: Procedure(s): LAPAROSCOPIC CHOLECYSTECTOMY WITH INTRAOPERATIVE CHOLANGIOGRAM (N/A)  Patient Location: PACU  Anesthesia Type:General  Level of Consciousness: awake, alert , oriented and patient cooperative  Airway & Oxygen Therapy: Patient Spontanous Breathing and Patient connected to face mask oxygen  Post-op Assessment: Report given to PACU RN and Post -op Vital signs reviewed and stable  Post vital signs: Reviewed and stable  Complications: No apparent anesthesia complications

## 2013-04-18 NOTE — Preoperative (Signed)
Beta Blockers   Reason not to administer Beta Blockers:Not Applicable 

## 2013-04-18 NOTE — Discharge Instructions (Signed)
[facility] ______[facility] LAPAROSCOPIC SURGERY: POST OP INSTRUCTIONS Always review your discharge instruction sheet given to you by the facility where your surgery was performed. IF YOU HAVE DISABILITY OR FAMILY LEAVE FORMS, YOU MUST BRING THEM TO THE OFFICE FOR PROCESSING.   DO NOT GIVE THEM TO YOUR DOCTOR.  1. A prescription for pain medication may be given to you upon discharge.  Take your pain medication as prescribed, if needed.  If narcotic pain medicine is not needed, then you may take acetaminophen (Tylenol) or ibuprofen (Advil) as needed. 2. Take your usually prescribed medications unless otherwise directed. 3. If you need a refill on your pain medication, please contact your pharmacy.  They will contact our office to request authorization. Prescriptions will not be filled after 5pm or on week-ends. 4. You should follow a light diet the first two days after arrival home, such as soup and crackers, etc.  Be sure to include lots of fluids daily.  May start a lowfat diet on 04/20/13. 5. Most patients will experience some swelling and bruising in the area of the incisions.  Ice packs will help.  Swelling and bruising can take several days to resolve.  6. It is common to experience some constipation if taking pain medication after surgery.  Increasing fluid intake and taking a stool softener (such as Colace) will usually help or prevent this problem from occurring.  A mild laxative (Milk of Magnesia or Miralax) should be taken according to package instructions if there are no bowel movements after 48 hours. 7. Unless discharge instructions indicate otherwise, you may remove your bandages 72 hours after surgery, and you may shower at that time.  You may have steri-strips (small skin tapes) in place directly over the incision.  These strips should be left on the skin for 14 days.  If your surgeon used skin glue on the incision, you may shower in 24 hours.  The glue will flake off over the  next 2-3 weeks.  Any sutures or staples will be removed at the office during your follow-up visit. 8. ACTIVITIES:  You may resume regular (light) daily activities beginning the next day--such as daily self-care, walking, climbing stairs--gradually increasing activities as tolerated.  You may have sexual intercourse when it is comfortable.  Refrain from any heavy lifting or straining-nothing over 10 pounds for 2 weeks.  a. You may drive when you are no longer taking prescription pain medication, you can comfortably wear a seatbelt, and you can safely maneuver your car and apply brakes. b. RETURN TO WORK:  _Desk work in one week, full duty in 2 weeks._________________________________________________________ 9. You should see your doctor in the office for a follow-up appointment approximately 2-3 weeks after your surgery.  Make sure that you call for this appointment within a day or two after you arrive home to insure a convenient appointment time. 10. OTHER INSTRUCTIONS: __________________________________________________________________________________________________________________________ __________________________________________________________________________________________________________________________ WHEN TO CALL YOUR DOCTOR: 1. Fever over 101.0 2. Inability to urinate 3. Continued bleeding from incision. 4. Increased pain, redness, or drainage from the incision. 5. Increasing abdominal pain  The clinic staff is available to answer your questions during regular business hours.  Please dont hesitate to call and ask to speak to one of the nurses for clinical concerns.  If you have a medical emergency, go to the nearest emergency room or call 911.  A surgeon from [facility] is always on call at the hospital. [address] [place], [place], [place]  [address] ?  [id number], [place], [place]   [address] [phone] ? [phone] ? FAX [phone] Web site:  [email or web address]

## 2013-04-18 NOTE — Op Note (Signed)
Preoperative diagnosis:  Symptomatic cholelithiasis  Postoperative diagnosis:  Same   Procedure: Laparoscopic cholecystectomy with cholangiogram.  Surgeon: Jackolyn Confer, M.D.  Asst.:  Ralene Ok, M.D.  Anesthesia: General  Indication:   This is a 43 year old female with biliary colic type pain after eating chicken wings.  She went to the ED and was discovered to have gallstones.  She now presents for elective cholecystectomy.  Technique: She was brought to the operating room, placed supine on the operating table, and a general anesthetic was administered. The hair on the abdominal wall was clipped as was necessary. The abdominal wall was then sterilely prepped and draped. Local anesthetic (Marcaine) was infiltrated in the subumbilical region. A small subumbilical incision was made through the skin, subcutaneous tissue, fascia, and peritoneum entering the peritoneal cavity under direct vision. A pursestring suture of 0 Vicryl was placed around the edges of the fascia. A Hassan trocar was introduced into the peritoneal cavity and a pneumoperitoneum was created by insufflation of carbon dioxide gas. The laparoscope was introduced into the trocar and no underlying bleeding or organ injury was noted. She was then placed in the reverse Trendelenburg position with the right side tilted slightly up.  Three 5 mm trocars were then placed into the abdominal cavity under laparoscopic vision. One in the epigastric area, and 2 in the right upper quadrant area. The gallbladder was visualized and the fundus was grasped and retracted toward the right shoulder.  The infundibulum was mobilized with dissection close to the gallbladder and retracted laterally. The cystic duct was identified and a window was created around it. The anterior branch of the cystic artery was also identified and a window was created around it. The critical view was achieved. A clip was placed at the neck of the gallbladder. A small  incision was made in the cystic duct. A cholangiocatheter was introduced through the anterior abdominal wall and placed in the cystic duct. A intraoperative cholangiogram was then performed.  Under real-time fluoroscopy, dilute contrast was injected into the cystic duct.  The common hepatic duct, the right and left hepatic ducts, and the common duct were all visualized. Contrast drained into the duodenum without obvious evidence of any obstructing ductal lesion. The final report is pending the Radiologist's interpretation.  The cholangiocatheter was removed, the cystic duct was clipped 3 times on the biliary side, and then the cystic duct was divided sharply. No bile leak was noted from the cystic duct stump.  The anterior branch of the cystic artery was then clipped and divided.  A posterior branch of the cystic artery was then identified.  A window was created around it.  It was clipped and divided.  Following this the gallbladder was dissected free from the liver using electrocautery.  A small puncture was made in the gallbladder with leakage of bile but no gallstones.  The gallbladder was then placed in a retrieval bag and removed from the abdominal cavity through the subumbilical incision.  The gallbladder fossa was inspected, copiously irrigated, and bleeding was controlled with electrocautery. Inspection showed that hemostasis was adequate and there was no evidence of bile leak.  The irrigation fluid was evacuated as much as possible.  The subumbilical trocar was removed and the fascial defect was closed by tightening and tying down the pursestring suture under laparoscopic vision.  The remaining trocars were removed and the pneumoperitoneum was released. The skin incisions were closed with 4-0 Monocryl subcuticular stitches. Steri-Strips and sterile dressings were applied.  The  procedure was well-tolerated without any apparent complications. She was taken to the recovery room in satisfactory  condition.

## 2013-04-18 NOTE — Anesthesia Preprocedure Evaluation (Signed)
Anesthesia Evaluation  Patient identified by MRN, date of birth, ID band Patient awake    Reviewed: Allergy & Precautions, H&P , NPO status , Patient's Chart, lab work & pertinent test results  Airway Mallampati: II TM Distance: >3 FB Neck ROM: Full    Dental no notable dental hx.    Pulmonary neg pulmonary ROS,  breath sounds clear to auscultation  Pulmonary exam normal       Cardiovascular negative cardio ROS  Rhythm:Regular Rate:Normal     Neuro/Psych negative neurological ROS  negative psych ROS   GI/Hepatic Neg liver ROS, GERD-  Medicated,  Endo/Other  Morbid obesity  Renal/GU negative Renal ROS  negative genitourinary   Musculoskeletal negative musculoskeletal ROS (+)   Abdominal   Peds negative pediatric ROS (+)  Hematology negative hematology ROS (+)   Anesthesia Other Findings   Reproductive/Obstetrics negative OB ROS                           Anesthesia Physical Anesthesia Plan  ASA: II  Anesthesia Plan: General   Post-op Pain Management:    Induction: Intravenous  Airway Management Planned: Oral ETT  Additional Equipment:   Intra-op Plan:   Post-operative Plan: Extubation in OR  Informed Consent: I have reviewed the patients History and Physical, chart, labs and discussed the procedure including the risks, benefits and alternatives for the proposed anesthesia with the patient or authorized representative who has indicated his/her understanding and acceptance.   Dental advisory given  Plan Discussed with: CRNA and Surgeon  Anesthesia Plan Comments:         Anesthesia Quick Evaluation

## 2013-04-18 NOTE — Anesthesia Postprocedure Evaluation (Signed)
  Anesthesia Post-op Note  Patient: Grace Graves  Procedure(s) Performed: Procedure(s) (LRB): LAPAROSCOPIC CHOLECYSTECTOMY WITH INTRAOPERATIVE CHOLANGIOGRAM (N/A)  Patient Location: PACU  Anesthesia Type: General  Level of Consciousness: awake and alert   Airway and Oxygen Therapy: Patient Spontanous Breathing  Post-op Pain: mild  Post-op Assessment: Post-op Vital signs reviewed, Patient's Cardiovascular Status Stable, Respiratory Function Stable, Patent Airway and No signs of Nausea or vomiting  Last Vitals:  Filed Vitals:   04/18/13 1500  BP: 128/69  Pulse: 72  Temp:   Resp: 12    Post-op Vital Signs: stable   Complications: No apparent anesthesia complications

## 2013-04-18 NOTE — Interval H&P Note (Signed)
History and Physical Interval Note:  04/18/2013 12:45 PM  Grace Graves  has presented today for surgery, with the diagnosis of gallstones  The various methods of treatment have been discussed with the patient and family. After consideration of risks, benefits and other options for treatment, the patient has consented to  Procedure(s): LAPAROSCOPIC CHOLECYSTECTOMY WITH INTRAOPERATIVE CHOLANGIOGRAM (N/A) as a surgical intervention .  The patient's history has been reviewed, patient examined, no change in status, stable for surgery.  I have reviewed the patient's chart and labs.  Questions were answered to the patient's satisfaction.     Kailani Brass Lenna Sciara

## 2013-04-19 ENCOUNTER — Encounter (HOSPITAL_COMMUNITY): Payer: Self-pay | Admitting: General Surgery

## 2013-04-28 ENCOUNTER — Ambulatory Visit (INDEPENDENT_AMBULATORY_CARE_PROVIDER_SITE_OTHER): Payer: Commercial Managed Care - PPO | Admitting: General Surgery

## 2013-04-28 ENCOUNTER — Encounter (INDEPENDENT_AMBULATORY_CARE_PROVIDER_SITE_OTHER): Payer: Self-pay | Admitting: General Surgery

## 2013-04-28 VITALS — BP 122/80 | HR 76 | Temp 98.0°F | Ht 68.0 in | Wt 265.0 lb

## 2013-04-28 DIAGNOSIS — Z4889 Encounter for other specified surgical aftercare: Secondary | ICD-10-CM

## 2013-04-28 NOTE — Progress Notes (Signed)
She is here for a postop visit following laparoscopic cholecystectomy.  She is doing well overall. She did try a fatty meal and had diarrhea following this.  PE:  ABD:  Soft, incisions clean/dry/intact and solid.  Assessment:  Doing well postop.  Plan:  Lowfat diet recommended. Back to work and resume normal activities in 4 days. Return visit prn.

## 2013-04-28 NOTE — Patient Instructions (Signed)
Recommend a low-fat diet. May return to work and resume full activities 05/02/13.

## 2013-05-01 DIAGNOSIS — E669 Obesity, unspecified: Secondary | ICD-10-CM | POA: Insufficient documentation

## 2014-04-12 ENCOUNTER — Encounter: Payer: Self-pay | Admitting: Certified Nurse Midwife

## 2014-04-13 ENCOUNTER — Ambulatory Visit (INDEPENDENT_AMBULATORY_CARE_PROVIDER_SITE_OTHER): Payer: 59 | Admitting: Certified Nurse Midwife

## 2014-04-13 ENCOUNTER — Encounter: Payer: Self-pay | Admitting: Certified Nurse Midwife

## 2014-04-13 VITALS — BP 108/74 | HR 70 | Resp 16 | Ht 68.25 in | Wt 276.0 lb

## 2014-04-13 DIAGNOSIS — Z0000 Encounter for general adult medical examination without abnormal findings: Secondary | ICD-10-CM

## 2014-04-13 DIAGNOSIS — Z01419 Encounter for gynecological examination (general) (routine) without abnormal findings: Secondary | ICD-10-CM

## 2014-04-13 DIAGNOSIS — Z124 Encounter for screening for malignant neoplasm of cervix: Secondary | ICD-10-CM

## 2014-04-13 NOTE — Progress Notes (Signed)
44 y.o. [id number] partnered  Caucasian Fe here for annual exam. Periods normal, now. Child now 2 and does not want any more children. Weight has been up and down since birth of child. Has signed up with [employer].  Sees PCP Dr.Marrow prn. Contraception none, desires tubal.  No health issues today.  Patient's last menstrual period was 04/12/2014.          Sexually active: Yes.    The current method of family planning is none.    Exercising: Yes.    cardio Smoker:  no  Health Maintenance: Pap:  06-19-11 ASCUS HPV HR neg, pap done after birth MMG:  none Colonoscopy:  2012 BMD:   none TDaP: 2015 Labs: none Self breast exam: done occ   reports that she has never smoked. She has never used smokeless tobacco. She reports that she drinks alcohol. She reports that she does not use illicit drugs.  Past Medical History  Diagnosis Date  . No pertinent past medical history   . AMA (advanced maternal age) multigravida 48+   . H/O varicella   . Abnormal Pap smear     ASCUS-H +HPV 11/11, 8/12  ascus, 5/13 ASCUS HPV-  . Depression     pp depression x2  - no problems at present  . GERD (gastroesophageal reflux disease)   . Gallstones     3 or 4 gallbladder attacks - with abdominal pain, nausea and vomiting and reflux  . Complication of anesthesia 12 yrs ago    woke up in middle of anesthesia for eye surgery  . Obese   . FHx: colon cancer   . Hx of ovarian cyst   . Depression, postpartum   . Ovarian cyst     Past Surgical History  Procedure Laterality Date  . Tonsillectomy    . Eye surgery      L eye  . Laparoscopy      for ovarian cyst  . Cholecystectomy N/A 04/18/2013    Procedure: LAPAROSCOPIC CHOLECYSTECTOMY WITH INTRAOPERATIVE CHOLANGIOGRAM;  Surgeon: Odis Hollingshead, MD;  Location: [facility];  Service: General;  Laterality: N/A;  . Colposcopy      2/12,8/12 HPV  changes only    Current Outpatient Prescriptions  Medication Sig Dispense Refill  . Multiple Vitamin (MULTIVITAMIN) tablet Take  1 tablet by mouth daily.     No current facility-administered medications for this visit.    Family History  Problem Relation Age of Onset  . Arthritis Mother   . COPD Mother   . Cancer Mother     colon and lung  . Depression Mother   . Diabetes Mother   . Heart disease Mother   . Hyperlipidemia Mother   . Hypertension Mother   . Hypertension Father   . Depression Father   . Depression Sister   . Kidney disease Sister   . Hypothyroidism Sister   . Other Sister     enlarged heart  . Fibromyalgia Sister     ROS:  Pertinent items are noted in HPI.  Otherwise, a comprehensive ROS was negative.  Exam:   BP 108/74 mmHg  Pulse 70  Resp 16  Ht 5' 8.25" (1.734 m)  Wt 276 lb (125.193 kg)  BMI 41.64 kg/m2  LMP 04/12/2014 Height: 5' 8.25" (173.4 cm) Ht Readings from Last 3 Encounters:  04/13/14 5' 8.25" (1.734 m)  04/28/13 5\' 8"  (1.727 m)  03/31/13 5\' 8"  (1.727 m)    General appearance: alert, cooperative and appears stated age Head:  Normocephalic, without obvious abnormality, atraumatic Neck: no adenopathy, supple, symmetrical, trachea midline and thyroid normal to inspection and palpation Lungs: clear to auscultation bilaterally Breasts: normal appearance, no masses or tenderness, No nipple retraction or dimpling, No nipple discharge or bleeding, No axillary or supraclavicular adenopathy Heart: regular rate and rhythm Abdomen: soft, non-tender; no masses,  no organomegaly Extremities: extremities normal, atraumatic, no cyanosis or edema Skin: Skin color, texture, turgor normal. No rashes or lesions Lymph nodes: Cervical, supraclavicular, and axillary nodes normal. No abnormal inguinal nodes palpated Neurologic: Grossly normal   Pelvic: External genitalia:  no lesions              Urethra:  normal appearing urethra with no masses, tenderness or lesions              Bartholin's and Skene's: normal                 Vagina: normal appearing vagina with normal color and  discharge, no lesions on menses              Cervix: normal ,  non tender, no lesions, blood present from cervix              Pap taken: Yes.   Bimanual Exam:  Uterus:  normal size, contour, position, consistency, mobility, non-tender              Adnexa: normal adnexa and no mass, fullness, tenderness               Rectovaginal: Confirms               Anus:  normal sphincter tone, no lesions  Chaperone present: Yes  A:  Well Woman with normal exam  Contraception desires tubal ligation  History of abnormal pap smear ASCUS + HPVHR 2011 normal pap since  Screening labs  Morbid obesity  P:   Reviewed health and wellness pertinent to exam  Patient will schedule consult with MD to discuss  Tubal ligation. Patient to use consistent condoms until surgery.  Lab: Lipid panel, TSH, CBC,Hgb A1-c  Discussed starting regular exercise program and working on portion control in diet. Patient considering gym membership again. Discussed increased risk of diabetes, hypertension and heart disease with being overwieght  Pap smear taken today with HPVHR   counseled on breast self exam, mammography screening and calling scheduling, has information to schedule., adequate intake of calcium and vitamin D, diet and exercise, Kegel's exercises return annually or prn  An After Visit Summary was printed and given to the patient.

## 2014-04-13 NOTE — Patient Instructions (Signed)
EXERCISE AND DIET:  We recommended that you start or continue a regular exercise program for good health. Regular exercise means any activity that makes your heart beat faster and makes you sweat.  We recommend exercising at least 30 minutes per day at least 3 days a week, preferably 4 or 5.  We also recommend a diet low in fat and sugar.  Inactivity, poor dietary choices and obesity can cause diabetes, heart attack, stroke, and kidney damage, among others.    ALCOHOL AND SMOKING:  Women should limit their alcohol intake to no more than 7 drinks/beers/glasses of wine (combined, not each!) per week. Moderation of alcohol intake to this level decreases your risk of breast cancer and liver damage. And of course, no recreational drugs are part of a healthy lifestyle.  And absolutely no smoking or even second hand smoke. Most people know smoking can cause heart and lung diseases, but did you know it also contributes to weakening of your bones? Aging of your skin?  Yellowing of your teeth and nails?  CALCIUM AND VITAMIN D:  Adequate intake of calcium and Vitamin D are recommended.  The recommendations for exact amounts of these supplements seem to change often, but generally speaking 600 mg of calcium (either carbonate or citrate) and 800 units of Vitamin D per day seems prudent. Certain women may benefit from higher intake of Vitamin D.  If you are among these women, your doctor will have told you during your visit.    PAP SMEARS:  Pap smears, to check for cervical cancer or precancers,  have traditionally been done yearly, although recent scientific advances have shown that most women can have pap smears less often.  However, every woman still should have a physical exam from her gynecologist every year. It will include a breast check, inspection of the vulva and vagina to check for abnormal growths or skin changes, a visual exam of the cervix, and then an exam to evaluate the size and shape of the uterus and  ovaries.  And after 44 years of age, a rectal exam is indicated to check for rectal cancers. We will also provide age appropriate advice regarding health maintenance, like when you should have certain vaccines, screening for sexually transmitted diseases, bone density testing, colonoscopy, mammograms, etc.   MAMMOGRAMS:  All women over 40 years old should have a yearly mammogram. Many facilities now offer a "3D" mammogram, which may cost around $50 extra out of pocket. If possible,  we recommend you accept the option to have the 3D mammogram performed.  It both reduces the number of women who will be called back for extra views which then turn out to be normal, and it is better than the routine mammogram at detecting truly abnormal areas.    COLONOSCOPY:  Colonoscopy to screen for colon cancer is recommended for all women at age 50.  We know, you hate the idea of the prep.  We agree, BUT, having colon cancer and not knowing it is worse!!  Colon cancer so often starts as a polyp that can be seen and removed at colonscopy, which can quite literally save your life!  And if your first colonoscopy is normal and you have no family history of colon cancer, most women don't have to have it again for 10 years.  Once every ten years, you can do something that may end up saving your life, right?  We will be happy to help you get it scheduled when you are ready.    Be sure to check your insurance coverage so you understand how much it will cost.  It may be covered as a preventative service at no cost, but you should check your particular policy.     Sterilization Information, Female Female sterilization is a procedure to permanently prevent pregnancy. There are different ways to perform sterilization, but all either block or close the fallopian tubes so that your eggs cannot reach your uterus. If your egg cannot reach your uterus, sperm cannot fertilize the egg, and you cannot get pregnant.  Sterilization is performed by  a surgical procedure. Sometimes these procedures are performed in a hospital while a patient is asleep. Sometimes they can be done in a clinic setting with the patient awake. The fallopian tubes can be surgically cut, tied, or sealed through a procedure called tubal ligation. The fallopian tubes can also be closed with clips or rings. Sterilization can also be done by placing a tiny coil into each fallopian tube, which causes scar tissue to grow inside the tube. The scar tissue then blocks the tubes.  Discuss sterilization with your caregiver to answer any concerns you or your partner may have. You may want to ask what type of sterilization your caregiver performs. Some caregivers may not perform all the various options. Sterilization is permanent and should only be done if you are sure you do not want children or do not want any more children. Having a sterilization reversed may not be successful.  STERILIZATION PROCEDURES  Laparoscopic sterilization. This is a surgical method performed at a time other than right after childbirth. Two incisions are made in the lower abdomen. A thin, lighted tube (laparoscope) is inserted into one of the incisions and is used to perform the procedure. The fallopian tubes are closed with a ring or a clip. An instrument that uses heat could be used to seal the tubes closed (electrocautery).   Mini-laparotomy. This is a surgical method done 1 or 2 days after giving birth. Typically, a small incision is made just below the belly button (umbilicus) and the fallopian tubes are exposed. The tubes can then be sealed, tied, or cut.   Hysteroscopic sterilization. This is performed at a time other than right after childbirth. A tiny, spring-like coil is inserted through the cervix and uterus and placed into the fallopian tubes. The coil causes scaring and blocks the tubes. Other forms of contraception should be used for 3 months after the procedure to allow the scar tissue to form  completely. Additionally, it is required hysterosalpingography be done 3 months later to ensure that the procedure was successful. Hysterosalpingography is a procedure that uses X-rays to look at your uterus and fallopian tubes after a material to make them show up better has been inserted. IS STERILIZATION SAFE? Sterilization is considered safe with very rare complications. Risks depend on the type of procedure you have. As with any surgical procedure, there are risks. Some risks of sterilization by any means include:   Bleeding.  Infection.  Reaction to anesthesia medicine.  Injury to surrounding organs. Risks specific to having hysteroscopic coils placed include:  The coils may not be placed correctly the first time.   The coils may move out of place.   The tubes may not get completely blocked after 3 months.   Injury to surrounding organs when placing the coil.  HOW EFFECTIVE IS FEMALE STERILIZATION? Sterilization is nearly 100% effective, but it can fail. Depending on the type of sterilization, the rate of failure can  be as high as 3%. After hysteroscopic sterilization with placement of fallopian tube coils, you will need back-up birth control for 3 months after the procedure. Sterilization is effective for a lifetime.  BENEFITS OF STERILIZATION  It does not affect your hormones, and therefore will not affect your menstrual periods, sexual desire, or performance.   It is effective for a lifetime.   It is safe.   You do not need to worry about getting pregnant. Keep in mind that if you had the hysteroscopic placement procedure, you must wait 3 months after the procedure (or until your caregiver confirms) before pregnancy is not considered possible.   There are no side effects unlike other types of birth control (contraception).  DRAWBACKS OF STERILIZATION  You must be sure you do not want children or any more children. The procedure is permanent.   It does not  provide protection against sexually transmitted infections (STIs).   The tubes can grow back together. If this happens, there is a risk of pregnancy. There is also an increased risk (50%) of pregnancy being an ectopic pregnancy. This is a pregnancy that happens outside of the uterus. Document Released: 07/23/2007 Document Revised: 02/08/2013 Document Reviewed: 05/22/2011 [facility] Patient Information 2015 [place], [place]. This information is not intended to replace advice given to you by your health care provider. Make sure you discuss any questions you have with your health care provider.

## 2014-04-14 LAB — CBC
HEMATOCRIT: 37.7 % (ref 36.0–46.0)
HEMOGLOBIN: 11.8 g/dL — AB (ref 12.0–15.0)
MCH: 25.6 pg — ABNORMAL LOW (ref 26.0–34.0)
MCHC: 31.3 g/dL (ref 30.0–36.0)
MCV: 81.8 fL (ref 78.0–100.0)
MPV: 9 fL (ref 8.6–12.4)
Platelets: 373 10*3/uL (ref 150–400)
RBC: 4.61 MIL/uL (ref 3.87–5.11)
RDW: 16.3 % — ABNORMAL HIGH (ref 11.5–15.5)
WBC: 6.5 10*3/uL (ref 4.0–10.5)

## 2014-04-14 LAB — LIPID PANEL
Cholesterol: 173 mg/dL (ref 0–200)
HDL: 37 mg/dL — ABNORMAL LOW (ref >=46)
LDL Cholesterol: 109 mg/dL — ABNORMAL HIGH (ref 0–99)
TRIGLYCERIDES: 134 mg/dL (ref <150)
Total CHOL/HDL Ratio: 4.7 Ratio
VLDL: 27 mg/dL (ref 0–40)

## 2014-04-14 LAB — HEMOGLOBIN A1C
HEMOGLOBIN A1C: 6.1 % — AB (ref <5.7)
Mean Plasma Glucose: 128 mg/dL — ABNORMAL HIGH (ref <117)

## 2014-04-14 LAB — TSH: TSH: 6.89 u[IU]/mL — AB (ref 0.350–4.500)

## 2014-04-15 NOTE — Progress Notes (Signed)
Encounter reviewed by Dr. Brook Silva.  

## 2014-04-17 ENCOUNTER — Telehealth: Payer: Self-pay

## 2014-04-17 LAB — IPS PAP TEST WITH HPV

## 2014-04-17 NOTE — Telephone Encounter (Signed)
Labs faxed to [facility] at [phone] with cover sheet and fax confirmation.  Routing to provider for final review. Patient agreeable to disposition. Will close encounter

## 2014-04-17 NOTE — Telephone Encounter (Signed)
Spoke with patient. Advised patient of results as seen below from Regina Eck CNM. Patient is agreeable and verbalizes understanding. Patient sees Dr.Morrow at [facility]. Advised will send labs to Dr.Morrow. Patient is agreeable and will call to schedule follow up appointment.

## 2014-04-17 NOTE — Telephone Encounter (Signed)
-----   Message from Regina Eck, CNM sent at 04/17/2014  9:46 AM EST ----- Notify patient that her overall lipid panel is normal. HDL beneficial cholesterol is slightly low at 37 should be 46 or greater TSH is elevated Hgb A1-c is elevated which she is at risk for diabetes level 6.1 Needs referral into PCP for management of diabetes and TSH, does she have PCP if so will need labs sent and appointment made CBC is essentially normal except HGB is slightly low at 11.8,normal 12.0 Increase iron food in diet. Recheck at aex Pap smear negative, HPVHR not detected 02

## 2014-04-18 ENCOUNTER — Other Ambulatory Visit: Payer: Self-pay

## 2014-04-18 DIAGNOSIS — Z1231 Encounter for screening mammogram for malignant neoplasm of breast: Secondary | ICD-10-CM

## 2014-04-26 ENCOUNTER — Ambulatory Visit
Admission: RE | Admit: 2014-04-26 | Discharge: 2014-04-26 | Disposition: A | Discharge status: Home / Self Care | Payer: 59 | Source: Ambulatory Visit

## 2014-04-26 DIAGNOSIS — Z1231 Encounter for screening mammogram for malignant neoplasm of breast: Secondary | ICD-10-CM

## 2014-05-03 ENCOUNTER — Encounter: Payer: Self-pay | Admitting: Dietician

## 2014-05-03 ENCOUNTER — Encounter: Payer: 59 | Attending: Family Medicine | Admitting: Dietician

## 2014-05-03 VITALS — Ht 68.0 in | Wt 273.7 lb

## 2014-05-03 DIAGNOSIS — Z713 Dietary counseling and surveillance: Secondary | ICD-10-CM | POA: Diagnosis not present

## 2014-05-03 DIAGNOSIS — Z6841 Body Mass Index (BMI) 40.0 and over, adult: Secondary | ICD-10-CM | POA: Insufficient documentation

## 2014-05-03 DIAGNOSIS — E669 Obesity, unspecified: Secondary | ICD-10-CM | POA: Insufficient documentation

## 2014-05-03 NOTE — Patient Instructions (Signed)
Goals:  Follow Diabetes Meal Plan as instructed  Eat 3 meals and 2 snacks, every 3-5 hrs  Limit carbohydrate intake to 30-45 grams carbohydrate/meal  Limit carbohydrate intake to 0-15 grams carbohydrate/snack  Add lean protein foods to meals/snacks  Aim for 150 minutes of exercise per week (resistance and cardio)  Try to take 20 minutes to eat meals without distractions

## 2014-05-03 NOTE — Progress Notes (Signed)
  Medical Nutrition Therapy:  Appt start time: 0800 end time:  0845.   Assessment:  Primary concerns today: Grace Graves is here today since her Hgb A1c was 6.1% and she does not want to get diabetes. Would like to understand the impact on blood sugar. Mother has diabetes. Has a glucometer from having gestational diabetes. Testing blood sugar sometimes and averaging 80-90 mg/dl in the morning. Can tell when her blood sugar is up. Has cut down on her sugar consumption and starting going to the gym in the last 2 weeks (since doctor's appointment).  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 Lives with her spouse and 3 kids and her spouse does most of the food shopping and meal preparation. Has been eating more snacks than meals sometimes, and missing about 3 meals per week. Eats out about once every 2 weeks. Lost 3 lbs in the past 2 weeks.    Preferred Learning Style:   No preference indicated   Learning Readiness:   Ready  MEDICATIONS: synthroid   DIETARY INTAKE:  Usual eating pattern includes 2-3 meals and 1-2 snacks per day.  Avoided foods include: kale, anchovies, mushrooms  24-hr recall:  B ( AM): banana or breakfast burrito from cafeteria Snk ( AM): none L ( PM): bring a salad with chicken and ranch or bag of popcorn Snk ( PM): nutrigrain bar D ( PM): hamburger meat and green beans Snk ( PM): none Beverages: water, coffee with flavored creamer, one soda per week  Usual physical activity: cardio at the gym and some resistance training for a total of 45 minutes 3-4 x week (started last week)  Estimated energy needs: 1800 calories 200 g carbohydrates 135 g protein 50 g fat  Progress Towards Goal(s):  In progress.   Nutritional Diagnosis:  [place]-2.1 Inpaired nutrition utilization As related to glucose metabolism .  As evidenced by HgB A1c of 6.1%.    Intervention:  Nutrition counseling provided. Goals:  Follow Diabetes Meal Plan as instructed  Eat  3 meals and 2 snacks, every 3-5 hrs  Limit carbohydrate intake to 30-45 grams carbohydrate/meal  Limit carbohydrate intake to 0-15 grams carbohydrate/snack  Add lean protein foods to meals/snacks  Aim for 150 minutes of exercise per week (resistance and cardio)  Try to take 20 minutes to eat meals without distractions   Teaching Method Utilized:  Visual Auditory Hands on  Handouts given during visit include:  Living Well With Diabetes  MyPlate handout  15 g CHO Snacks  Blood Glucose Monitoring  Barriers to learning/adherence to lifestyle change: none  Demonstrated degree of understanding via:  Teach Back   Monitoring/Evaluation:  Dietary intake, exercise, and body weight prn.

## 2014-09-20 ENCOUNTER — Telehealth: Payer: Self-pay | Admitting: Certified Nurse Midwife

## 2014-09-20 NOTE — Telephone Encounter (Signed)
Spoke with patient. Patient states that she would like to discuss starting on birth control. Appointment scheduled for 09/22/2014 at 2pm with Regina Eck CNM. Patient is agreeable to date and time.  Routing to provider for final review. Patient agreeable to disposition. Will close encounter.   Patient aware provider will review message and nurse will return call if any additional advice or change of disposition.

## 2014-09-20 NOTE — Telephone Encounter (Signed)
Patient would like to talk with Evalee Mutton about staring birth control.  Last seen 04/13/14.

## 2014-09-22 ENCOUNTER — Encounter: Payer: Self-pay | Admitting: Certified Nurse Midwife

## 2014-09-22 ENCOUNTER — Ambulatory Visit (INDEPENDENT_AMBULATORY_CARE_PROVIDER_SITE_OTHER): Payer: 59 | Admitting: Certified Nurse Midwife

## 2014-09-22 VITALS — BP 110/78 | HR 74 | Resp 20 | Ht 68.25 in | Wt 272.0 lb

## 2014-09-22 DIAGNOSIS — Z30011 Encounter for initial prescription of contraceptive pills: Secondary | ICD-10-CM | POA: Diagnosis not present

## 2014-09-22 MED ORDER — ETONOGESTREL-ETHINYL ESTRADIOL 0.12-0.015 MG/24HR VA RING: 1.0000 | VAGINAL_RING | VAGINAL | Status: DC

## 2014-09-22 NOTE — Patient Instructions (Signed)

## 2014-09-22 NOTE — Progress Notes (Signed)
44 y.o. Divorced white female presents to discuss need for contraception.  Previous methods used include condoms, oral contraceptives (estrogen/progesterone), Depo-Provera, NuvaRing vaginal inserts. Patient was happy with pill, but concerned about remembering to take it. Nuvaring worked well, with no problems and would prefer to start on it again. Depo Provera caused amenorrhea and was not happy with that. Non smoker, no migraine or DVT history. Currently being treated for hypothyroid and working with medication dosage, which is settling down. Working on weight loss and sees PCP for aex and Thyroid management. Previous history of Gall bladder problems with stones, but had cholecystectomy. No problems since removal. Plan is to start contraception and stay on for at least 6 months and then plan BTL. Mammogram current 04/26/14  Normal.  No other health concerns today. Current contraception: condoms  O:  Healthy female WDWN  Obese Affect: normal orientation x 3 AEX 04/13/14 normal  Pap: negative, HPVHR not detected   A:  Initiation of Contraception desires Nuvaring  Plan:  Discussion of Advantages and disadvantages of contraception  Less Dysmenorrhea  Cost      Lighter flow   Nausea/ vomiting    Regulation of cycles  Weight gain    Acne improvement  Breast tenderness   PMS improvment  No protection from STD's     Headaches  Discussed effectiveness rates, BUM, and concerns with interfering with her thyroid medication. Questions addressed. Patient has decided on NuvaRing vaginal inserts  Rx Nuvaring see order with instructions given and instruction booklet Instructed to start on first day of next period. Will need condoms during first cycle. Expectations and warning signs reviewed.  Rv 3 months from onset of use to evaluate and prn as needed.

## 2014-09-22 NOTE — Progress Notes (Signed)
Reviewed personally.  M. [name] Earma Nicolaou, MD.  

## 2015-01-10 ENCOUNTER — Telehealth: Payer: Self-pay | Admitting: Genetic Counselor

## 2015-01-10 NOTE — Telephone Encounter (Signed)
SPOKE WITH PT REGARDING GENETIC COUNSELING REFERRAL. PT CONFIRMED APPT.

## 2015-01-10 NOTE — Telephone Encounter (Signed)
CONTACTED REFERRING OFFICE AND INFORMED ABOUT APPT DATE AND TIME

## 2015-02-02 ENCOUNTER — Telehealth: Payer: Self-pay | Admitting: Genetic Counselor

## 2015-02-02 NOTE — Telephone Encounter (Signed)
Asked if the patient could try to get a copy of her mother's genetic test result for her appointment on Monday. Discussed reasoning behind this request including cost of testing, accuracy and confirmation of diagnosis.  Patient will try to obtain a copy.

## 2015-02-05 ENCOUNTER — Other Ambulatory Visit: Payer: 59

## 2015-02-05 ENCOUNTER — Encounter: Payer: Self-pay | Admitting: Genetic Counselor

## 2015-02-05 ENCOUNTER — Ambulatory Visit (HOSPITAL_BASED_OUTPATIENT_CLINIC_OR_DEPARTMENT_OTHER): Payer: 59 | Admitting: Genetic Counselor

## 2015-02-05 DIAGNOSIS — Z86010 Personal history of colonic polyps: Secondary | ICD-10-CM | POA: Diagnosis not present

## 2015-02-05 DIAGNOSIS — Z800 Family history of malignant neoplasm of digestive organs: Secondary | ICD-10-CM | POA: Diagnosis not present

## 2015-02-05 DIAGNOSIS — Z8371 Family history of colonic polyps: Secondary | ICD-10-CM

## 2015-02-05 DIAGNOSIS — Z315 Encounter for genetic counseling: Secondary | ICD-10-CM | POA: Diagnosis not present

## 2015-02-05 DIAGNOSIS — K635 Polyp of colon: Secondary | ICD-10-CM | POA: Insufficient documentation

## 2015-02-05 NOTE — Progress Notes (Signed)
REFERRING PROVIDER: London Pepper, MD [place], [place] [address]   Arta Silence, MD  PRIMARY PROVIDER:  London Pepper, MD  PRIMARY REASON FOR VISIT:  1. Family history of colon cancer   2. Family history of Lynch syndrome   3. History of colonic polyps   4. Family history of colonic polyps   5. Colon polyps      HISTORY OF PRESENT ILLNESS:   Ms. Parody, a 44 y.o. female, was seen for a [facility] cancer genetics consultation at the request of Dr. Paulita Fujita due to a family history of colon cancer, and a diagnosis of Bea Laura syndrome in her mother.  Ms. Lathrop presents to clinic today to discuss the possibility of a hereditary predisposition to cancer, genetic testing, and to further clarify her future cancer risks, as well as potential cancer risks for family members. Ms. Nitsch is a 44 y.o. female with no personal history of cancer.  MS. Kilcrease has had three colonoscopies, and has a history of colon polyps.  CANCER HISTORY:   No history exists.     HORMONAL RISK FACTORS:  Menarche was at age 65.  First live birth at age 1.  OCP use for approximately <5 years.  Ovaries intact: yes.  Hysterectomy: no.  Menopausal status: premenopausal.  HRT use: 0 years. Infertility treatment: Yes, IUI and clomid. Colonoscopy: yes; one polyp found. Mammogram within the last year: yes. Number of breast biopsies: 0. Up to date with pelvic exams:  yes. Any excessive radiation exposure in the past:  no  Past Medical History  Diagnosis Date  . No pertinent past medical history   . AMA (advanced maternal age) multigravida 51+   . H/O varicella   . Abnormal Pap smear     ASCUS-H +HPV 11/11, 8/12  ascus, 5/13 ASCUS HPV-  . Depression     pp depression x2  - no problems at present  . GERD (gastroesophageal reflux disease)   . Gallstones     3 or 4 gallbladder attacks - with abdominal pain, nausea and vomiting and reflux  . Complication of anesthesia 12 yrs  ago    woke up in middle of anesthesia for eye surgery  . Obese   . FHx: colon cancer   . Hx of ovarian cyst   . Depression, postpartum   . Ovarian cyst   . Family history of colon cancer   . Colon polyps     Past Surgical History  Procedure Laterality Date  . Tonsillectomy    . Eye surgery      L eye  . Laparoscopy      for ovarian cyst  . Cholecystectomy N/A 04/18/2013    Procedure: LAPAROSCOPIC CHOLECYSTECTOMY WITH INTRAOPERATIVE CHOLANGIOGRAM;  Surgeon: Odis Hollingshead, MD;  Location: [facility];  Service: General;  Laterality: N/A;  . Colposcopy      2/12,8/12 HPV  changes only    Social History   Social History  . Marital Status: Divorced    Spouse Name: N/A  . Number of Children: 2  . Years of Education: N/A   Social History Main Topics  . Smoking status: Never Smoker   . Smokeless tobacco: Never Used  . Alcohol Use: No  . Drug Use: No  . Sexual Activity:    Partners: Male    Birth Control/ Protection: None   Other Topics Concern  . None   Social History Narrative     FAMILY HISTORY:  We obtained  a detailed, 4-generation family history.  Significant diagnoses are listed below: Family History  Problem Relation Age of Onset  . Arthritis Mother   . COPD Mother   . Cancer Mother     colon and lung  . Depression Mother   . Diabetes Mother   . Heart disease Mother   . Hyperlipidemia Mother   . Hypertension Mother   . Hypertension Father   . Depression Father   . Depression Sister   . Kidney disease Sister   . Hypothyroidism Sister   . Other Sister     enlarged heart  . Fibromyalgia Sister   . Cancer Maternal Aunt     NOS  . Melanoma Paternal Uncle   . SIDS Brother     The patient has three boys, one with ADHD but none with cancer.  She has three brothers and two sisters.  One brother has a history of colon polyps.  Her mother was diagnosed with colon cancer at about age 26.  She has also been diagnosed with lung cancer, COPD, and skin cancer.   The skin cancer apparently was tested and she was found to have [employer] syndrome.  Ms. Ezra's mother has three sisters and two brothers.  One sister had a form of cancer.  The patient's father is alive and well.  He had 66 brothers and sisters.  Two brothers had melanoma in their 24s.  There is no other reported family history of cancer.  Patient's maternal ancestors are of [place] and [place] descent, and paternal ancestors are of [place], [place] and [place] descent. There is no reported Ashkenazi Jewish ancestry. There is no known consanguinity.  GENETIC COUNSELING ASSESSMENT: YAELIN MEGER is a 44 y.o. 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 We, therefore, discussed and recommended the following at today's visit.   DISCUSSION: We discussed that Bea Laura syndrome is a sub category of Lynch syndrome, and is considered a colorectal cancer syndrome. We reviewed the characteristics, features and inheritance patterns of hereditary cancer syndromes.  Individuals with Bea Laura syndrome have sebaceous neoplasms, but have mutations in the same genes as Lynch syndrome.  We discussed that individuals with Lynch syndrome are at increased risk for specific cancers including colon, uterine, ovarian, gastric, pancreatic, ureter and other cancers.  There are 5 genes associated with Lynch syndrome, and depending on the gene with the mutation, the cancer risk can be higher or lower.    Ms. Jablonowski indicated that her mother was tested through her dermatologist and had her "mole" tested.  She was unsure whether this genetic test was performed on this "mole" or if she had blood testing.  We discussed that if the actual mole was tested, that this was considered "tumor testing'.  Tumors can have mutations in them that are unique to the tumor as well as germline (hereditary)  mutations.  However, if her mother had blood testing, then she had hereditary testing which would then place Ms. Marlette at increased risk.  We discussed that in the latter scenerio she is at 50% risk for having the mutation found in her mother.  It will be important to get a copy of her mother's genetic testing.  If the testing Ms. Rocca's mother had was tumor testing, then this decreases the liklihood that there is Lynch syndrome in the family simply based on family history and that tumor  testing is not diagnostic for hereditary cancer syndromes.  Based on this test, we would request that Ms. Doten's mother either have genetic testing for Lynch syndrome to confirm whether the mutation found in the tumor was real, or if there could be another hereditary cancer syndrome causing her colon cancer in her 52s.  If Ms. Hilgert's mother had hereditary testing we can easily order testing for the mutation found in her mother.  We also discussed genetic testing, including the appropriate family members to test, the process of testing, insurance coverage and turn-around-time for results. We discussed the implications of a negative, positive and/or variant of uncertain significant result.   We recommended Ms. Rotundo wait until we receive a copy of her mother's genetic test and then regroup at that time to determine the appropriate test.   PLAN: Based on Ms. Dilday's family history, we recommended waiting on the test results from her mother to determine who to proceed.  Lastly, we encouraged Ms. Coakley to remain in contact with cancer genetics annually so that we can continuously update the family history and inform her of any changes in cancer genetics and testing that may be of benefit for this family.   Ms.  Vandezande's questions were answered to her satisfaction today. Our contact information was provided should additional questions or concerns arise. Thank you for the referral and allowing [place] to share in the care of  your patient.   Tenasia Aull P. Florene Glen, [place], [facility] Certified Genetic Counselor [email or web address] phone: [phone]  The patient was seen for a total of 60 minutes in face-to-face genetic counseling.  This patient was discussed with Drs. Magrinat, Lindi Adie and/or Burr Medico who agrees with the above.    _______________________________________________________________________ For Office Staff:  Number of people involved in session: 1 Was an Intern/ student involved with case: no

## 2015-03-02 ENCOUNTER — Encounter: Payer: Self-pay | Admitting: Genetic Counselor

## 2015-03-02 ENCOUNTER — Other Ambulatory Visit: Payer: 59

## 2015-03-02 DIAGNOSIS — K635 Polyp of colon: Secondary | ICD-10-CM

## 2015-03-02 DIAGNOSIS — Z800 Family history of malignant neoplasm of digestive organs: Secondary | ICD-10-CM

## 2015-03-02 NOTE — Progress Notes (Signed)
Abbygael sent a copy of her mother's IHC testing performed on a sebaceous adenoma which showed loss of MSH6.  We will perform testing on MSH6, reflexing to the rest of the Lynch genes for completeness.

## 2015-03-12 MED FILL — LEVOTHYROXINE 88 MCG TABLET: 88 | 30 days supply | Qty: 30 | Fill #4

## 2015-03-13 ENCOUNTER — Other Ambulatory Visit: Payer: Self-pay | Admitting: Certified Nurse Midwife

## 2015-03-13 MED FILL — NUVARING VAGINAL RING: 0.12-0.015 | 27 days supply | Qty: 1 | Fill #2

## 2015-03-13 NOTE — Telephone Encounter (Signed)
Medication refill request: Nuvaring Last AEX:  04-13-14 Last OV: 09-22-14 Next AEX: not scheduled  Last MMG (if hormonal medication request): 04-27-14 WNL Refill authorized: please advise

## 2015-03-15 DIAGNOSIS — K635 Polyp of colon: Secondary | ICD-10-CM | POA: Diagnosis not present

## 2015-03-15 DIAGNOSIS — Z800 Family history of malignant neoplasm of digestive organs: Secondary | ICD-10-CM | POA: Diagnosis not present

## 2015-03-15 DIAGNOSIS — Z8371 Family history of colonic polyps: Secondary | ICD-10-CM | POA: Diagnosis not present

## 2015-03-15 DIAGNOSIS — Z86010 Personal history of colonic polyps: Secondary | ICD-10-CM | POA: Diagnosis not present

## 2015-03-22 ENCOUNTER — Other Ambulatory Visit: Payer: Self-pay

## 2015-03-22 DIAGNOSIS — Z1231 Encounter for screening mammogram for malignant neoplasm of breast: Secondary | ICD-10-CM

## 2015-03-28 ENCOUNTER — Telehealth: Payer: Self-pay | Admitting: Genetic Counselor

## 2015-03-28 ENCOUNTER — Encounter: Payer: Self-pay | Admitting: Genetic Counselor

## 2015-03-28 DIAGNOSIS — Z1379 Encounter for other screening for genetic and chromosomal anomalies: Secondary | ICD-10-CM | POA: Insufficient documentation

## 2015-03-28 NOTE — Telephone Encounter (Signed)
Revealed positive MSH6 mutation found confirming Lynch syndrome.  We will see her on Monday at 40 for genetic counseling.

## 2015-03-30 ENCOUNTER — Encounter: Payer: Self-pay | Admitting: Genetic Counselor

## 2015-03-30 DIAGNOSIS — Z1509 Genetic susceptibility to other malignant neoplasm: Secondary | ICD-10-CM | POA: Insufficient documentation

## 2015-04-02 ENCOUNTER — Encounter: Payer: Self-pay | Admitting: Genetic Counselor

## 2015-04-02 ENCOUNTER — Ambulatory Visit (HOSPITAL_BASED_OUTPATIENT_CLINIC_OR_DEPARTMENT_OTHER): Payer: 59 | Admitting: Genetic Counselor

## 2015-04-02 DIAGNOSIS — Z808 Family history of malignant neoplasm of other organs or systems: Secondary | ICD-10-CM | POA: Diagnosis not present

## 2015-04-02 DIAGNOSIS — Z800 Family history of malignant neoplasm of digestive organs: Secondary | ICD-10-CM

## 2015-04-02 DIAGNOSIS — K635 Polyp of colon: Secondary | ICD-10-CM

## 2015-04-02 DIAGNOSIS — Z315 Encounter for genetic counseling: Secondary | ICD-10-CM

## 2015-04-02 DIAGNOSIS — Z1379 Encounter for other screening for genetic and chromosomal anomalies: Secondary | ICD-10-CM

## 2015-04-02 DIAGNOSIS — Z1509 Genetic susceptibility to other malignant neoplasm: Secondary | ICD-10-CM | POA: Insufficient documentation

## 2015-04-02 NOTE — Progress Notes (Signed)
HPI: Ms. Dabbs was previously seen in the [facility] due to a family of cancer and concerns regarding a hereditary predisposition to cancer. Please refer to our prior cancer genetics clinic note for more information regarding Ms. Bachicha's medical, social and family histories, and our assessment and recommendations, at the time. Ms. Ferreri's recent genetic test results were disclosed to her, as were recommendations warranted by these results. These results and recommendations are discussed in more detail below.   FAMILY HISTORY:  We obtained a detailed, 4-generation family history.  Significant diagnoses are listed below: Family History  Problem Relation Age of Onset  . Arthritis Mother   . COPD Mother   . Cancer Mother     colon and lung  . Depression Mother   . Diabetes Mother   . Heart disease Mother   . Hyperlipidemia Mother   . Hypertension Mother   . Hypertension Father   . Depression Father   . Depression Sister   . Kidney disease Sister   . Hypothyroidism Sister   . Other Sister     enlarged heart  . Fibromyalgia Sister   . Cancer Maternal Aunt     NOS  . Melanoma Paternal Uncle   . SIDS Brother     The patient has three boys, one with ADHD but none with cancer. She has three brothers and two sisters. One brother has a history of colon polyps. Her mother was diagnosed with colon cancer at about age 16. She has also been diagnosed with lung cancer, COPD, and skin cancer. The skin cancer apparently was tested and she was found to have [employer] syndrome. Ms. Bahri's mother has three sisters and two brothers. One sister had a form of cancer. The patient's father is alive and well. He had 20 brothers and sisters. Two brothers had melanoma in their 85s. There is no other reported family history of cancer. Patient's maternal ancestors are of [place] and [place] descent, and paternal ancestors are of [place], [place] and [place] descent. There  is no reported Ashkenazi Jewish ancestry. There is no known consanguinity.  GENETIC TEST RESULTS: At the time of Ms. Reeser's visit, we recommended she pursue genetic testing of the MSH6 gene. This test, which included sequencing and deletion/duplication analysis of the genes listed on the test report, was performed at [facility].  Genetic testing identified a pathogenic gene mutation called MSH6 [id number], confirming the diagnosis of Lynch syndrome. A copy of the test report has been scanned into Epic for review.   SCREENING RECOMMENDATIONS: We discussed the implications of Lynch syndrome for Ms. Jaimes, and discussed who else in the family should have genetic testing. We recommended Ms. Lundahl follow management guidelines for Lynch syndrome; all of which are outlined below. These can be coordinated by Ms. Nierenberg's GI doctor or her primary provider.   1. Colonoscopy every 1-2 years until age 51.  After age 63, annual colonoscopy.   2. While there is no clear evidence to support screening for stomach and small bowel cancer, an upper endoscopy can be considered at 3-5 year intervals beginning at age 64-35. However, whether to have this screening is best determined by the gastroenterologist.   3. Annual urinalysis.   For women with Lynch syndrome, unlike the effective surveillance plan for colorectal cancer risk, there is no professional agreement regarding management for the increased risk of uterine and ovarian cancer. However, we are available to help women and their providers establish an individualized surveillance plan.  It is also important for women to understand the following:   1. Women should seek medical attention if they experience abnormal vaginal bleeding.  2. Some providers may still recommend vaginal ultrasounds, uterine biopsies (for uterine cancer risk) and/or CA-125 analysis ( for ovarian cancer risk), even though these have not been shown to be effective.  3. A  hysterectomy with removal of the ovaries and fallopian tubes should be considered once childbearing is completed (if planned).  FAMILY MEMBERS: Since we now know the mutation in Ms. Gearin, we can test at-risk relatives to determine whether or not they have inherited the mutation and are at increased risk for cancer. We will be happy to meet with any of the family members or refer them to a genetic counselor in their local area. To locate genetic counselors in other cities, individuals can visit the website of the [employer] (Artist[employer].se) and Secretary/administrator for a Social worker by zip code.   All of Ms. Voyles's maternal relatives should be made aware of this result because they may be at increased risk of cancer.  Genetic testing in your relatives is critical as it can sort out who in your family is at risk and who is not.    Ms. Burnsworth's children and siblings have a 50% chance to have inherited the same mutation and can thus undergo genetic testing.  Once children reach age 3, they should be offered genetic counseling and testing so that they can use this important information to guide their medical care.  Those relatives that are found to have this mutation would need to follow the above recommendations.  Those relatives they do not have the mutation would be at the general population risk for cancers associated with Lynch syndrome and would not need additional screening beyond that of the general population.  Ms. Ringel was provided information about Lynch syndrome including a brochure about colon cancer syndromes, information about Lynch syndrome from the [employer], and information about the [employer].  We strongly encouraged Ms. Saiz to remain in contact with [place] in cancer genetics on an annual basis so we can update Ms. Dura's personal and family histories, and inform her of advances in cancer genetics that may be of benefit for the entire  family. Ms. Barich knows she is also welcome to call with any questions or concerns, at any time.   Roma Kayser, MS, [facility] Genetic Counselor [phone]

## 2015-04-03 ENCOUNTER — Telehealth: Payer: Self-pay | Admitting: *Deleted

## 2015-04-03 ENCOUNTER — Telehealth: Payer: Self-pay | Admitting: Obstetrics & Gynecology

## 2015-04-03 DIAGNOSIS — Z1509 Genetic susceptibility to other malignant neoplasm: Secondary | ICD-10-CM

## 2015-04-03 NOTE — Telephone Encounter (Signed)
Spoke with pt regarding benefit for ultrasound and endometrial biopsy. Patient understood and agreeable. Patient is scheduled 04/05/15 with Dr Sabra Heck. Patient aware of arrival date and time. Patient aware of 72 hours cancellation policy with [id number] fee. No further questions. Ok to close

## 2015-04-03 NOTE — Telephone Encounter (Signed)
Reviewed with French Ana CNM and Dr Sabra Heck. Advised providers have received and reviewed noted from genetic counselor. Recommend proceed with PUS/Endo biopsy and CA125 with Dr Sabra Heck and then AEX with Debbi as scheduled for March 2017. Then with all of this information, can make recommendations and plan. Patient agreeable and PUS/Endo biopsy scheduled for 04-05-15. Brief explanation of procedure. Instructed to take Motrin 800 mg one hour prior with food.

## 2015-04-03 NOTE — Telephone Encounter (Signed)
Thank you. Encounter closed. 

## 2015-04-05 ENCOUNTER — Ambulatory Visit (INDEPENDENT_AMBULATORY_CARE_PROVIDER_SITE_OTHER): Payer: 59

## 2015-04-05 ENCOUNTER — Ambulatory Visit (INDEPENDENT_AMBULATORY_CARE_PROVIDER_SITE_OTHER): Payer: 59 | Admitting: Obstetrics & Gynecology

## 2015-04-05 ENCOUNTER — Encounter: Payer: Self-pay | Admitting: Obstetrics & Gynecology

## 2015-04-05 VITALS — BP 110/84 | HR 88 | Resp 16 | Ht 68.25 in | Wt 265.0 lb

## 2015-04-05 DIAGNOSIS — N859 Noninflammatory disorder of uterus, unspecified: Secondary | ICD-10-CM | POA: Diagnosis not present

## 2015-04-05 DIAGNOSIS — Z1509 Genetic susceptibility to other malignant neoplasm: Secondary | ICD-10-CM | POA: Diagnosis not present

## 2015-04-05 DIAGNOSIS — Z0000 Encounter for general adult medical examination without abnormal findings: Secondary | ICD-10-CM | POA: Diagnosis not present

## 2015-04-05 DIAGNOSIS — R7309 Other abnormal glucose: Secondary | ICD-10-CM | POA: Diagnosis not present

## 2015-04-05 DIAGNOSIS — Z124 Encounter for screening for malignant neoplasm of cervix: Secondary | ICD-10-CM | POA: Diagnosis not present

## 2015-04-05 DIAGNOSIS — Z1504 Genetic susceptibility to malignant neoplasm of endometrium: Secondary | ICD-10-CM | POA: Diagnosis not present

## 2015-04-05 DIAGNOSIS — E039 Hypothyroidism, unspecified: Secondary | ICD-10-CM | POA: Diagnosis not present

## 2015-04-05 DIAGNOSIS — R5383 Other fatigue: Secondary | ICD-10-CM | POA: Diagnosis not present

## 2015-04-05 NOTE — Progress Notes (Signed)
HPI: 44 y.o. G56P3013 Divorced Caucasian female here for endometrial biopsy and PUS due to recent diagnosis of Lynch syndrome with MSH6 genetic abnormality.  Pt's mother had hx of colon cancer and recurrent skin cancers.  Her dermatologist is who originally recommended the genetic testing.  Pt has very good understanding of the diagnosis and is aware of significant increased risks for endometrial cancer as well as ovarian cancer from gyn standpoint.  She has already been considering hysterectomy.  This is the recommendation for her with BSO especially since she is completed with child bearing.  As a result, pt is very interested in going ahead and scheduling this.    Laparoscopic approach and procedure discussed with patient.  Hospital stay, recovery and pain management all discussed.  Risks discussed including but not limited to bleeding, 1% risk of receiving a  transfusion, infection, 3-4% risk of bowel/bladder/ureteral/vascular injury discussed as well as possible need for additional surgery if injury does occur discussed.  DVT/PE and rare risk of death discussed.  My actual complications with prior surgeries discussed.  Vaginal cuff dehiscence discussed.  Hernia formation discussed.  Positioning and incision locations discussed.  Patient aware if pathology abnormal she may need additional treatment.  Will plan endometrial biopsy today.  +/- ca-125 testing reviewed.  Since PUS normal, no ca-125 will be obtained.  Need for post operative HRT, most likely, reviewed.  Pt voiced clear understanding and all questions answered.  GYNECOLOGIC HISTORY: Patient's last menstrual period was 03/18/2015. Contraception: Nuva ring Last Pap:  04/13/14 Last MMG:  04/26/14  Patient Active Problem List   Diagnosis Date Noted  . MSH6-related Lynch syndrome (HNPCC5)   . Lynch syndrome 03/30/2015  . Genetic testing 03/28/2015  . Family history of colon cancer   . Colon polyps   . Obese     Past Medical History    Diagnosis Date  . AMA (advanced maternal age) multigravida 62+   . H/O varicella   . Abnormal Pap smear     ASCUS-H +HPV 11/11, 8/12  ascus, 5/13 ASCUS HPV-  . Depression     pp depression x2  - no problems at present  . GERD (gastroesophageal reflux disease)   . Gallstones     3 or 4 gallbladder attacks - with abdominal pain, nausea and vomiting and reflux  . Complication of anesthesia 12 yrs ago    woke up in middle of anesthesia for eye surgery  . Obese   . FHx: colon cancer   . Depression, postpartum   . Colon polyps   . MSH6-related Lynch syndrome [facility])     Past Surgical History  Procedure Laterality Date  . Tonsillectomy    . Eye surgery      L eye  . Laparoscopy      for ovarian cyst  . Cholecystectomy N/A 04/18/2013    Procedure: LAPAROSCOPIC CHOLECYSTECTOMY WITH INTRAOPERATIVE CHOLANGIOGRAM;  Surgeon: Odis Hollingshead, MD;  Location: [facility];  Service: General;  Laterality: N/A;  . Colposcopy      2/12,8/12 HPV  changes only    Current Outpatient Prescriptions  Medication Sig Dispense Refill  . levothyroxine (SYNTHROID, LEVOTHROID) 88 MCG tablet Take 88 mcg by mouth daily before breakfast.    . Multiple Vitamin (MULTIVITAMIN) tablet Take 1 tablet by mouth daily.    Marland Kitchen NUVARING 0.12-0.015 MG/24HR vaginal ring INSERT VAGINALLY AND LEAVE IN PLACE FOR 3 CONSECUTIVE WEEKS, THEN REMOVE FOR 1 WEEK. 1 each 1   No current facility-administered medications for this  visit.     ALLERGIES: Review of patient's allergies indicates no known allergies.  Fam Hx reviewed  Soc Hx: Divorced.  Non smokier.  Denies drug or ETOH use.     Review of Systems  All other systems reviewed and are negative.   PHYSICAL EXAMINATION:   BP 110/84 mmHg  Pulse 88  Resp 16  Ht 5' 8.25" (1.734 m)  Wt 265 lb (120.203 kg)  BMI 39.98 kg/m2  LMP 03/18/2015     General appearance: alert, cooperative and appears stated age Head: Normocephalic, without obvious abnormality,  atraumatic Neck: no adenopathy, supple, symmetrical, trachea midline and thyroid normal to inspection and palpation Lungs: clear to auscultation bilaterally Breasts: normal appearance, no masses or tenderness Heart: regular rate and rhythm Abdomen: soft, non-tender; bowel sounds normal; no masses,  no organomegaly Extremities: extremities normal, atraumatic, no cyanosis or edema Skin: Skin color, texture, turgor normal. No rashes or lesions Lymph nodes: Cervical, supraclavicular, and axillary nodes normal. No abnormal inguinal nodes palpated Neurologic: Grossly normal   Pelvic: External genitalia:  no lesions              Urethra:  normal appearing urethra with no masses, tenderness or lesions              Bartholins and Skenes: normal                 Vagina: normal appearing vagina with normal color and discharge, no lesions              Cervix: no lesions              Pap taken: Yes.   Bimanual Exam:  Uterus:  normal size, contour, position, consistency, mobility, non-tender              Adnexa: normal adnexa and no mass, fullness, tenderness               Rectovaginal: Confirms               Anus:  normal sphincter tone, no lesions  Endometrial biopsy recommended.  Discussed with patient.  Verbal and written consent obtained.   Procedure:  Speculum placed.  Cervix visualized and cleansed with betadine prep.  A single toothed tenaculum was applied to the anterior lip of the cervix.  Endometrial pipelle was advanced through the cervix into the endometrial cavity without difficulty.  Pipelle passed to 8.5cm.  Suction applied and pipelle removed with small amount of tissue sample obtained.  Biopsy repeated with better tissue specimen noted.  Tenculum removed.  No bleeding noted.  Patient tolerated procedure well.  Chaperone was present for exam.  PUS results: Uterus:  9.5 x 4.1 x 4.7cm Endometrium:  4.62m Left ovary 2.2 x 1.1 x 0.9cm Right ovary 1.7 x 1.3 x 0.818m A:  H/O Lynch  syndrome with MSH6 genetic mutation with significantly increased risks of uterine and ovarian cancers noted Obesity S/P cholecystectomy H/O diagnostic laparoscopy with h/o endometriosis H/O 3 prior NSVDs Hypothyroidism  P:   Endometrial biopsy pending Will plan TLH/BSO/cystoscopy on pt.  Surgery will be scheduled at pt's convenience Pap obtained but will make sure this can be sent from insurance standpoint.  May need to be repeated right before surgery. No rx's for medication given today as procedure is not scheduled at this point.  ~25 minutes spent with patient >50% of time was in face to face discussion of above.

## 2015-04-09 ENCOUNTER — Telehealth: Payer: Self-pay | Admitting: Obstetrics & Gynecology

## 2015-04-09 NOTE — Addendum Note (Signed)
Addended by: Megan Salon on: 04/09/2015 11:09 AM   Modules accepted: Orders, Medications

## 2015-04-09 NOTE — Telephone Encounter (Signed)
Called patient to review benefits for a recommended procedure. Left Voicemail requesting a call back. °

## 2015-04-10 ENCOUNTER — Telehealth: Payer: Self-pay | Admitting: *Deleted

## 2015-04-10 LAB — IPS PAP TEST WITH REFLEX TO HPV

## 2015-04-10 NOTE — Telephone Encounter (Signed)
Patient notified of endometrial biopsy result as directed by Dr Sabra Heck. Surgery date options discussed.  Surgery scheduled for 05-21-15 at 0730 at [facility]. Surgery instruction sheet reviewed and printed copy will be mailed, see copy scanned to chart.  Patient is scheduled for tooth extraction and oral bone graft on 04-17-15. Advised this should not interfere with surgery for 05-21-15 but will confirm with Dr Sabra Heck and call back if any changes.    Routing to provider for final review. Patient agreeable to disposition. Will close encounter.

## 2015-04-10 NOTE — Telephone Encounter (Signed)
Routing to Dr. Miller.  This is her patient.  

## 2015-04-10 NOTE — Telephone Encounter (Signed)
-----   Message from Megan Salon, MD sent at 04/09/2015  6:00 PM EST ----- Inform pt endometrial biopsy was negative for abnormal cells.  Pt with Lynch Syndrome.  Needs TLH/BSO/cystoscopy.  Thanks.  CC:  Emelia Salisbury, CMA

## 2015-04-10 NOTE — Telephone Encounter (Signed)
Spoke with pt regarding benefit for surgery. Patient understood and agreeable. Patient ready to schedule. Patient provided surgery deposit over the phone. Patient aware this is professional benefit only. Patient aware will be contacted by hospital for separate benefits. Staff message to Sally for scheduling °

## 2015-04-11 ENCOUNTER — Encounter: Payer: Self-pay | Admitting: Certified Nurse Midwife

## 2015-04-11 ENCOUNTER — Other Ambulatory Visit: Payer: Self-pay | Admitting: Certified Nurse Midwife

## 2015-04-11 MED FILL — LEVOTHYROXINE 100 MCG TAB: 100 | 30 days supply | Qty: 30 | Fill #0

## 2015-04-11 MED FILL — NUVARING VAGINAL RING: 0.12-0.015 | 55 days supply | Qty: 2 | Fill #0

## 2015-04-11 NOTE — Telephone Encounter (Signed)
Medication refill request: Nuvaring Last AEX:  04-13-14 Next AEX: not scheduled Next OV: 05-01-15 Last MMG (if hormonal medication request): 04-27-14 Refill authorized: please advise

## 2015-04-12 ENCOUNTER — Other Ambulatory Visit: Payer: Self-pay | Admitting: Obstetrics & Gynecology

## 2015-04-12 NOTE — Telephone Encounter (Signed)
Approved      Disp Refills Start End    NUVARING 0.12-0.015 MG/24HR vaginal ring 1 each 1 04/11/2015     Sig:  INSERT VAGINALLY AND LEAVE IN PLACE FOR 3 CONSECUTIVE WEEKS, THEN REMOVE FOR 1 WEEK.    Class:  Normal    DAW:  No    Authorizing Provider:  Nunzio Cobbs, MD    Patient's request for refill of Nuvaring sent via mychart was approved by Dr.Silva on 04/11/2015. Will close encounter.

## 2015-04-16 MED FILL — HYDROCODON-APAP 10-325: 10-325 | 5 days supply | Qty: 30 | Fill #0

## 2015-04-16 MED FILL — CHLORHEXIDINE 0.12% RINSE: 0.12 | 16 days supply | Qty: 473 | Fill #0

## 2015-04-16 MED FILL — AMOXICILLIN 500 MG CAPSULE: 500 | 7 days supply | Qty: 21 | Fill #0

## 2015-04-16 MED FILL — DEXAMETHASONE 4 MG TABLET: 4 | 3 days supply | Qty: 9 | Fill #0

## 2015-04-18 ENCOUNTER — Ambulatory Visit: Payer: 59 | Admitting: Certified Nurse Midwife

## 2015-04-20 DIAGNOSIS — Z1509 Genetic susceptibility to other malignant neoplasm: Secondary | ICD-10-CM | POA: Diagnosis not present

## 2015-04-20 MED FILL — GAVILYTE-N SOLUTION: 420 | 1 days supply | Qty: 4000 | Fill #0

## 2015-04-27 ENCOUNTER — Ambulatory Visit
Admission: RE | Admit: 2015-04-27 | Discharge: 2015-04-27 | Disposition: A | Discharge status: Home / Self Care | Payer: 59 | Source: Ambulatory Visit

## 2015-04-27 ENCOUNTER — Other Ambulatory Visit: Payer: Self-pay | Admitting: Gastroenterology

## 2015-04-27 DIAGNOSIS — Z1231 Encounter for screening mammogram for malignant neoplasm of breast: Secondary | ICD-10-CM | POA: Diagnosis not present

## 2015-04-30 ENCOUNTER — Encounter (HOSPITAL_COMMUNITY): Payer: Self-pay | Admitting: *Deleted

## 2015-04-30 ENCOUNTER — Other Ambulatory Visit: Payer: Self-pay | Admitting: Gastroenterology

## 2015-05-01 ENCOUNTER — Ambulatory Visit (INDEPENDENT_AMBULATORY_CARE_PROVIDER_SITE_OTHER): Payer: 59 | Admitting: Obstetrics & Gynecology

## 2015-05-01 ENCOUNTER — Encounter: Payer: Self-pay | Admitting: Obstetrics & Gynecology

## 2015-05-01 VITALS — BP 118/82 | HR 78 | Resp 16 | Wt 264.0 lb

## 2015-05-01 DIAGNOSIS — Z1502 Genetic susceptibility to malignant neoplasm of ovary: Secondary | ICD-10-CM

## 2015-05-01 DIAGNOSIS — Z1509 Genetic susceptibility to other malignant neoplasm: Secondary | ICD-10-CM | POA: Diagnosis not present

## 2015-05-01 DIAGNOSIS — Z1504 Genetic susceptibility to malignant neoplasm of endometrium: Secondary | ICD-10-CM

## 2015-05-01 NOTE — Progress Notes (Signed)
Patient ID: Grace Graves, female   DOB: Aug 17, 1970, 45 y.o.   MRN: [id number]   45 y.o. [id number] DivorcedCaucasian female here for discussion of results from last visit on 04/05/15.  PUT showed normal ovaries.  Endometrial biopsy was negative for abnormal cells.  Pt has colonoscopy and EGD planned with Dr. Dulce Sellar tomorrow.  Pt had pap obtained 04/05/15 as well.  Ca-125 was not obtained as ovaries were normal.  Pt has decided to proceed with definitive surgical treatment due to increased risks of uterine and ovarian cancer with Lynch syndrome with MSH6 genetic abnormality.    Laparoscopic hysterectomy with BSO is now scheduled.  Procedure discussed with patient.  Hospital stay, recovery and pain management all discussed.  Risks discussed including but not limited to bleeding, 1% risk of receiving a  transfusion, infection, 3-4% risk of bowel/bladder/ureteral/vascular injury discussed as well as possible need for additional surgery if injury does occur discussed.  DVT/PE and rare risk of death discussed.  My actual complications with prior surgeries discussed.  Vaginal cuff dehiscence discussed.  Hernia formation discussed.  Positioning and incision locations discussed.  Patient aware if pathology abnormal she may need additional treatment.  All questions answered.    We have discussed post-op HRT use and, for now, she states she does not want to do this.  She is going to try and "deal with it" if she can.  Alternative medications discussed as well.  We will just wait and see how she does and if medication is needed, will decide then.   Ob Hx:   Patient's last menstrual period was 04/09/2015.          Sexually active: Yes.   Birth control: NuvaRing vaginal inserts Last pap: 04/05/15 Neg Last MMG: 04-27-15 BIRADS1:Neg Tobacco: no  Past Surgical History  Procedure Laterality Date  . Tonsillectomy    . Eye surgery      L eye  . Laparoscopy      for ovarian cyst  . Cholecystectomy N/A 04/18/2013   Procedure: LAPAROSCOPIC CHOLECYSTECTOMY WITH INTRAOPERATIVE CHOLANGIOGRAM;  Surgeon: Adolph Pollack, MD;  Location: [facility];  Service: General;  Laterality: N/A;  . Colposcopy      2/12,8/12 HPV  changes only    Past Medical History  Diagnosis Date  . AMA (advanced maternal age) multigravida 35+   . H/O varicella   . Abnormal Pap smear     ASCUS-H +HPV 11/11, 8/12  [place], 5/13 ASCUS HPV-  . Depression     pp depression x2  - no problems at present  . GERD (gastroesophageal reflux disease)   . Gallstones     3 or 4 gallbladder attacks - with abdominal pain, nausea and vomiting and reflux  . Complication of anesthesia 12 yrs ago    woke up in middle of anesthesia for eye surgery  . Obese   . FHx: colon cancer   . Depression, postpartum   . Colon polyps   . Hypothyroidism   . MSH6-related Lynch syndrome [facility])     "genetic tendency to form cancer"-none at present.    Allergies: Review of patient's allergies indicates no known allergies.  Current Outpatient Prescriptions  Medication Sig Dispense Refill  . levothyroxine (SYNTHROID, LEVOTHROID) 100 MCG tablet Take 100 mcg by mouth daily.  1  . Multiple Vitamin (MULTIVITAMIN) tablet Take 1 tablet by mouth daily.    Marland Kitchen NUVARING 0.12-0.015 MG/24HR vaginal ring INSERT VAGINALLY AND LEAVE IN PLACE FOR 3 CONSECUTIVE WEEKS, THEN REMOVE FOR 1  WEEK. 1 each 1   No current facility-administered medications for this visit.    ROS: A comprehensive review of systems was negative.  Exam:    BP 118/82 mmHg  Pulse 78  Resp 16  Wt 264 lb (119.75 kg)  LMP 04/09/2015  General appearance: alert and cooperative Head: Normocephalic, without obvious abnormality, atraumatic Neck: no adenopathy, supple, symmetrical, trachea midline and thyroid not enlarged, symmetric, no tenderness/mass/nodules Lungs: clear to auscultation bilaterally Heart: regular rate and rhythm, S1, S2 normal, no murmur, click, rub or gallop Abdomen: soft, non-tender;  bowel sounds normal; no masses,  no organomegaly Extremities: extremities normal, atraumatic, no cyanosis or edema Skin: Skin color, texture, turgor normal. No rashes or lesions Lymph nodes: Cervical, supraclavicular, and axillary nodes normal. no inguinal nodes palpated Neurologic: Grossly normal  Pelvic: External genitalia:  no lesions              Urethra: normal appearing urethra with no masses, tenderness or lesions              Bartholins and Skenes: Bartholin's, Urethra, Skene's normal                 Vagina: normal appearing vagina with normal color and discharge, no lesions              Cervix: normal appearance              Pap taken: No.        Bimanual Exam:  Uterus:  uterus is normal size, shape, consistency and nontender                                      Adnexa:    normal adnexa in size, nontender and no masses                                      Rectovaginal: Deferred                                      Anus:  normal sphincter tone, no lesions  A: H/O increased genetic risks for ovarian and uterine cancer with Lynch Syndrome with +MSH6 genetic abnormality Family hx of colon cancer.  Colonoscopy and EGD planned tomorrow. Obesity  P:  Total laparoscopic hysterectomy, BSO, cystoscopy planned She will need yearly urinalysis for cytology Rx for Motrin and Percocet given. Hysterectomy brochure given for pre and post op instructions.  ~25 minutes spent with patient >50% of time was in face to face discussion of above.

## 2015-05-02 ENCOUNTER — Encounter (HOSPITAL_COMMUNITY)
Admission: RE | Disposition: A | Discharge status: Home / Self Care | Payer: Self-pay | Source: Ambulatory Visit | Attending: Gastroenterology

## 2015-05-02 ENCOUNTER — Ambulatory Visit (HOSPITAL_COMMUNITY): Payer: 59 | Admitting: Anesthesiology

## 2015-05-02 ENCOUNTER — Encounter (HOSPITAL_COMMUNITY): Payer: Self-pay

## 2015-05-02 ENCOUNTER — Ambulatory Visit (HOSPITAL_COMMUNITY)
Admission: RE | Admit: 2015-05-02 | Discharge: 2015-05-02 | Disposition: A | Discharge status: Home / Self Care | Payer: 59 | Source: Ambulatory Visit | Attending: Gastroenterology | Admitting: Gastroenterology

## 2015-05-02 DIAGNOSIS — D122 Benign neoplasm of ascending colon: Secondary | ICD-10-CM | POA: Diagnosis not present

## 2015-05-02 DIAGNOSIS — Z1509 Genetic susceptibility to other malignant neoplasm: Secondary | ICD-10-CM | POA: Diagnosis not present

## 2015-05-02 DIAGNOSIS — E669 Obesity, unspecified: Secondary | ICD-10-CM | POA: Insufficient documentation

## 2015-05-02 DIAGNOSIS — Z8371 Family history of colonic polyps: Secondary | ICD-10-CM | POA: Insufficient documentation

## 2015-05-02 DIAGNOSIS — E039 Hypothyroidism, unspecified: Secondary | ICD-10-CM | POA: Insufficient documentation

## 2015-05-02 DIAGNOSIS — Z86010 Personal history of colonic polyps: Secondary | ICD-10-CM | POA: Insufficient documentation

## 2015-05-02 DIAGNOSIS — Z79899 Other long term (current) drug therapy: Secondary | ICD-10-CM | POA: Insufficient documentation

## 2015-05-02 DIAGNOSIS — D123 Benign neoplasm of transverse colon: Secondary | ICD-10-CM | POA: Insufficient documentation

## 2015-05-02 DIAGNOSIS — K635 Polyp of colon: Secondary | ICD-10-CM | POA: Diagnosis not present

## 2015-05-02 DIAGNOSIS — Z800 Family history of malignant neoplasm of digestive organs: Secondary | ICD-10-CM | POA: Insufficient documentation

## 2015-05-02 DIAGNOSIS — Z6838 Body mass index (BMI) 38.0-38.9, adult: Secondary | ICD-10-CM | POA: Diagnosis not present

## 2015-05-02 HISTORY — DX: Hypothyroidism, unspecified: E03.9

## 2015-05-02 HISTORY — PX: COLONOSCOPY WITH PROPOFOL: SHX5780

## 2015-05-02 HISTORY — PX: ESOPHAGOGASTRODUODENOSCOPY (EGD) WITH PROPOFOL: SHX5813

## 2015-05-02 SURGERY — ESOPHAGOGASTRODUODENOSCOPY (EGD) WITH PROPOFOL
Anesthesia: Monitor Anesthesia Care

## 2015-05-02 MED ORDER — LIDOCAINE HCL (CARDIAC) 20 MG/ML IV SOLN
INTRAVENOUS | Status: DC | PRN
  Administered 2015-05-02: 50 mg via INTRAVENOUS

## 2015-05-02 MED ORDER — PROPOFOL 10 MG/ML IV BOLUS
INTRAVENOUS | Status: AC
  Filled 2015-05-02: qty 40

## 2015-05-02 MED ORDER — SODIUM CHLORIDE 0.9 % IV SOLN: INTRAVENOUS | Status: DC

## 2015-05-02 MED ORDER — PROPOFOL 10 MG/ML IV BOLUS
INTRAVENOUS | Status: AC
  Filled 2015-05-02: qty 20

## 2015-05-02 MED ORDER — PROPOFOL 500 MG/50ML IV EMUL
INTRAVENOUS | Status: DC | PRN
  Administered 2015-05-02: 150 ug/kg/min via INTRAVENOUS

## 2015-05-02 MED ORDER — LIDOCAINE HCL (CARDIAC) 20 MG/ML IV SOLN
INTRAVENOUS | Status: AC
  Filled 2015-05-02: qty 5

## 2015-05-02 MED ORDER — LACTATED RINGERS IV SOLN
INTRAVENOUS | Status: DC
  Administered 2015-05-02: 10:00:00 via INTRAVENOUS

## 2015-05-02 MED ORDER — PROPOFOL 10 MG/ML IV BOLUS
INTRAVENOUS | Status: DC | PRN
  Administered 2015-05-02: 50 mg via INTRAVENOUS

## 2015-05-02 SURGICAL SUPPLY — 24 items

## 2015-05-02 NOTE — Discharge Instructions (Signed)
Esophagogastroduodenoscopy, Care After Refer to this sheet in the next few weeks. These instructions provide you with information about caring for yourself after your procedure. Your health care provider may also give you more specific instructions. Your treatment has been planned according to current medical practices, but problems sometimes occur. Call your health care provider if you have any problems or questions after your procedure. WHAT TO EXPECT AFTER THE PROCEDURE After your procedure, it is typical to feel:  Soreness in your throat.  Pain with swallowing.  Sick to your stomach (nauseous).  Bloated.  Dizzy.  Fatigued. HOME CARE INSTRUCTIONS  Do not eat or drink anything until the numbing medicine (local anesthetic) has worn off and your gag reflex has returned. You will know that the local anesthetic has worn off when you can swallow comfortably.  Do not drive or operate machinery until directed by your health care provider.  Take medicines only as directed by your health care provider. SEEK MEDICAL CARE IF:   You cannot stop coughing.  You are not urinating at all or less than usual. SEEK IMMEDIATE MEDICAL CARE IF:  You have difficulty swallowing.  You cannot eat or drink.  You have worsening throat or chest pain.  You have dizziness or lightheadedness or you faint.  You have nausea or vomiting.  You have chills.  You have a fever.  You have severe abdominal pain.  You have black, tarry, or bloody stools.   This information is not intended to replace advice given to you by your health care provider. Make sure you discuss any questions you have with your health care provider.   Document Released: 01/21/2012 Document Revised: 02/24/2014 Document Reviewed: 01/21/2012 Elsevier Interactive Patient Education [employer].     Colonoscopy A colonoscopy is an exam to look at your colon. This exam can help find lumps (tumors), growths (polyps),  bleeding, and redness and puffiness (inflammation) in your colon.  BEFORE THE PROCEDURE  Ask your doctor about changing or stopping your regular medicines.  You may need to drink a large amount of a special liquid (oral bowel prep). You start drinking this the day before your procedure. It will cause you to have watery poop (stool). This cleans out your colon.  Do not eat or drink anything else once you have started the bowel prep, unless your doctor tells you it is safe to do so.  Make plans for someone to drive you home after the procedure. PROCEDURE  You will be given medicine to help you relax (sedative).  You will lie on your side with your knees bent.  A tube with a camera on the end is put in the opening of your butt (anus) and into your colon. Pictures are sent to a computer screen. Your doctor will look for anything that is not normal.  Your doctor may take a tissue sample (biopsy) from your colon to be looked at more closely.  The exam is finished when your doctor has viewed all of the colon. AFTER THE PROCEDURE  Do not drive for 24 hours after the exam.  You may have a small amount of blood in your poop. This is normal.  You may pass gas and have belly (abdominal) cramps. This is normal.  Ask when your test results will be ready. Make sure you get your test results.   This information is not intended to replace advice given to you by your health care provider. Make sure you discuss any questions you have  with your health care provider.   Document Released: 03/08/2010 Document Revised: 02/08/2013 Document Reviewed: 10/11/2012 Elsevier Interactive Patient Education [employer].

## 2015-05-02 NOTE — Anesthesia Postprocedure Evaluation (Signed)
Anesthesia Post Note  Patient: JALASIA KENNINGTON  Procedure(s) Performed: Procedure(s) (LRB): ESOPHAGOGASTRODUODENOSCOPY (EGD) WITH PROPOFOL (N/A) COLONOSCOPY WITH PROPOFOL (N/A)  Patient location during evaluation: PACU Anesthesia Type: MAC Level of consciousness: awake and alert Pain management: pain level controlled Vital Signs Assessment: post-procedure vital signs reviewed and stable Respiratory status: spontaneous breathing, nonlabored ventilation, respiratory function stable and patient connected to nasal cannula oxygen Cardiovascular status: blood pressure returned to baseline and stable Postop Assessment: no signs of nausea or vomiting Anesthetic complications: no    Last Vitals:  Filed Vitals:   05/02/15 1140 05/02/15 1150  BP: 138/62 132/73  Pulse: 71   Temp:    Resp: 17     Last Pain: There were no vitals filed for this visit.               Sayge Salvato S

## 2015-05-02 NOTE — Op Note (Signed)
[facility] Patient Name: Grace Graves Procedure Date: 05/02/2015 MRN: [id number] Attending MD: Arta Silence , MD Date of Birth: 22-Sep-1970 CSN:  Age: 45 Admit Type: Inpatient Account #: [id number] Procedure:                Colonoscopy Indications:              Last colonoscopy: December 2012, Family history of                            colon cancer in a first-degree relative, Family                            history of colonic polyps in a first-degree                            relative, Personal history of hereditary                            nonpolyposis colorectal cancer Providers:                Arta Silence, MD, Cleda Daub, RN, Janie                            Billups, Technician, [facility], CRNA Referring MD:              Medicines:                Propofol per Anesthesia Complications:            No immediate complications. Estimated Blood Loss:     Estimated blood loss: none. Procedure:                Pre-Anesthesia Assessment:                           - Prior to the procedure, a History and Physical                            was performed, and patient medications and                            allergies were reviewed. The patient's tolerance of                            previous anesthesia was also reviewed. The risks                            and benefits of the procedure and the sedation                            options and risks were discussed with the patient.                            All questions were answered, and informed consent  was obtained. Prior Anticoagulants: The patient has                            taken no previous anticoagulant or antiplatelet                            agents. ASA Grade Assessment: III - A patient with                            severe systemic disease. After reviewing the risks                            and benefits, the patient was deemed in          satisfactory condition to undergo the procedure.                           - Prior to the procedure, a History and Physical                            was performed, and patient medications and                            allergies were reviewed. The patient's tolerance of                            previous anesthesia was also reviewed. The risks                            and benefits of the procedure and the sedation                            options and risks were discussed with the patient.                            All questions were answered, and informed consent                            was obtained. Prior Anticoagulants: The patient has                            taken no previous anticoagulant or antiplatelet                            agents. ASA Grade Assessment: III - A patient with                            severe systemic disease. After reviewing the risks                            and benefits, the patient was deemed in                            satisfactory condition to  undergo the procedure.                           After obtaining informed consent, the colonoscope                            was passed under direct vision. Throughout the                            procedure, the patient's blood pressure, pulse, and                            oxygen saturations were monitored continuously. The                            [id number]) scope was introduced through                            the anus and advanced to the the cecum, identified                            by appendiceal orifice and ileocecal valve. The                            ileocecal valve, appendiceal orifice, and rectum                            were photographed. The entire colon was examined.                            The colonoscopy was performed without difficulty.                            The patient tolerated the procedure well. The                            quality of the  bowel preparation was good. Scope In: 10:53:45 AM Scope Out: 11:13:45 AM Scope Withdrawal Time: 0 hours 14 minutes 13 seconds  Total Procedure Duration: 0 hours 20 minutes 0 seconds  Findings:      The perianal and digital rectal examinations were normal.      A 5 mm polyp was found in the ascending colon. The polyp was sessile.       The polyp was removed with a hot snare. Resection and retrieval were       complete.      The exam was otherwise normal throughout the examined colon.      The retroflexed view of the distal rectum and anal verge was normal and       showed no anal or rectal abnormalities. Impression:               - One 5 mm polyp in the ascending colon, removed                            with a hot snare. Resected and retrieved.                           -  The distal rectum and anal verge are normal on                            retroflexion view. Moderate Sedation:      N/A- Per Anesthesia Care Recommendation:           - Patient has a contact number available for                            emergencies. The signs and symptoms of potential                            delayed complications were discussed with the                            patient. Return to normal activities tomorrow.                            Written discharge instructions were provided to the                            patient.                           - Discharge patient to home (ambulatory).                           - Resume previous diet today.                           - Continue present medications.                           - Await pathology results.                           - Repeat colonoscopy in 1-2 year for surveillance                            based on pathology results, given personal history                            of Lynch Syndrome.                           - Return to GI clinic PRN.                           - Return to referring physician as previously                             scheduled. Procedure Code(s):        --- Professional ---                           [phone], Colonoscopy, flexible; with removal of  tumor(s), polyp(s), or other lesion(s) by snare                            technique Diagnosis Code(s):        --- Professional ---                           D12.2, Benign neoplasm of ascending colon                           Z80.0, Family history of malignant neoplasm of                            digestive organs                           Z83.71, Family history of colonic polyps                           Z85.038, Personal history of other malignant                            neoplasm of large intestine                           Z15.09, Genetic susceptibility to other malignant                            neoplasm CPT copyright 2016 American Medical Association. All rights reserved. The codes documented in this report are preliminary and upon coder review may  be revised to meet current compliance requirements. Arta Silence, MD Arta Silence, MD 05/02/2015 11:28:04 AM This report has been signed electronically. Number of Addenda: 0

## 2015-05-02 NOTE — H&P (Signed)
Patient interval history reviewed.  Patient examined again.  There has been no change from documented H/P dated 04/20/15 (scanned into chart from our office) except as documented above.  Assessment:  1.  Lynch Syndrome.  Plan:  1.  Endoscopy. 2.  Risks (bleeding, infection, bowel perforation that could require surgery, sedation-related changes in cardiopulmonary systems), benefits (identification and possible treatment of source of symptoms, exclusion of certain causes of symptoms), and alternatives (watchful waiting, radiographic imaging studies, empiric medical treatment) of upper endoscopy (EGD) were explained to patient/family in detail and patient wishes to proceed. 3.  Colonoscopy. 4.  Risks (bleeding, infection, bowel perforation that could require surgery, sedation-related changes in cardiopulmonary systems), benefits (identification and possible treatment of source of symptoms, exclusion of certain causes of symptoms), and alternatives (watchful waiting, radiographic imaging studies, empiric medical treatment) of colonoscopy were explained to patient/family in detail and patient wishes to proceed.

## 2015-05-02 NOTE — Op Note (Signed)
[facility] Patient Name: Grace Graves Procedure Date: 05/02/2015 MRN: [id number] Attending MD: Arta Silence , MD Date of Birth: [birth year] CSN:  Age: 45 Admit Type: Inpatient Account #: [id number] Procedure:                Upper GI endoscopy Indications:              Hereditary nonpolyposis colorectal cancer (Lynch                            Syndrome) Providers:                Arta Silence, MD, Cleda Daub, RN, Janie                            Billups, Technician, [facility], CRNA Referring MD:             London Pepper, MD Medicines:                Propofol per Anesthesia Complications:            No immediate complications. Estimated Blood Loss:     Estimated blood loss: none. Estimated blood loss:                            none. Procedure:                Pre-Anesthesia Assessment:                           - Prior to the procedure, a History and Physical                            was performed, and patient medications and                            allergies were reviewed. The patient's tolerance of                            previous anesthesia was also reviewed. The risks                            and benefits of the procedure and the sedation                            options and risks were discussed with the patient.                            All questions were answered, and informed consent                            was obtained. Prior Anticoagulants: The patient has                            taken no previous anticoagulant or antiplatelet  agents. ASA Grade Assessment: III - A patient with                            severe systemic disease. After reviewing the risks                            and benefits, the patient was deemed in                            satisfactory condition to undergo the procedure.                           After obtaining informed consent, the endoscope was     passed under direct vision. Throughout the                            procedure, the patient's blood pressure, pulse, and                            oxygen saturations were monitored continuously. The                            [id number] [phone]) scope was introduced through the                            mouth, and advanced to the second part of duodenum.                            The upper GI endoscopy was accomplished without                            difficulty. The patient tolerated the procedure                            well. Scope In: Scope Out: Findings:      The examined esophagus was normal.      The entire examined stomach was normal.      The duodenal bulb, first portion of the duodenum and second portion of       the duodenum were normal. Impression:               - Normal esophagus.                           - Normal stomach.                           - Normal duodenal bulb, first portion of the                            duodenum and second portion of the duodenum. Moderate Sedation:      N/A- Per Anesthesia Care Recommendation:           - Patient has a contact number available for  emergencies. The signs and symptoms of potential                            delayed complications were discussed with the                            patient. Return to normal activities tomorrow.                            Written discharge instructions were provided to the                            patient.                           - Repeat endoscopy in 3 years.                           - Perform a colonoscopy today.                           - Follow an antireflux regimen.                           - Continue present medications. Procedure Code(s):        --- Professional ---                           [phone], Esophagogastroduodenoscopy, flexible,                            transoral; diagnostic, including collection of                             specimen(s) by brushing or washing, when performed                            (separate procedure) Diagnosis Code(s):        --- Professional ---                           Z15.09, Genetic susceptibility to other malignant                            neoplasm CPT copyright 2016 American Medical Association. All rights reserved. The codes documented in this report are preliminary and upon coder review may  be revised to meet current compliance requirements. Arta Silence, MD Arta Silence, MD 05/02/2015 11:24:04 AM This report has been signed electronically. Number of Addenda: 0

## 2015-05-02 NOTE — Transfer of Care (Signed)
Immediate Anesthesia Transfer of Care Note  Patient: Grace Graves  Procedure(s) Performed: Procedure(s): ESOPHAGOGASTRODUODENOSCOPY (EGD) WITH PROPOFOL (N/A) COLONOSCOPY WITH PROPOFOL (N/A)  Patient Location: PACU  Anesthesia Type:MAC  Level of Consciousness:  sedated, patient cooperative and responds to stimulation  Airway & Oxygen Therapy:Patient Spontanous Breathing and Patient connected to face mask oxgen  Post-op Assessment:  Report given to PACU RN and Post -op Vital signs reviewed and stable  Post vital signs:  Reviewed and stable  Last Vitals:  Filed Vitals:   05/02/15 0910 05/02/15 1122  BP: 150/74   Pulse: 85 74  Temp: 36.9 C 36.6 C  Resp: 20 19    Complications: No apparent anesthesia complications

## 2015-05-02 NOTE — Anesthesia Preprocedure Evaluation (Signed)
Anesthesia Evaluation  Patient identified by MRN, date of birth, ID band Patient awake    Reviewed: Allergy & Precautions, NPO status , Patient's Chart, lab work & pertinent test results  Airway Mallampati: II  TM Distance: >3 FB Neck ROM: Full    Dental no notable dental hx.    Pulmonary neg pulmonary ROS,    Pulmonary exam normal breath sounds clear to auscultation       Cardiovascular negative cardio ROS Normal cardiovascular exam Rhythm:Regular Rate:Normal     Neuro/Psych negative neurological ROS  negative psych ROS   GI/Hepatic negative GI ROS, Neg liver ROS,   Endo/Other  Hypothyroidism Morbid obesity  Renal/GU negative Renal ROS  negative genitourinary   Musculoskeletal negative musculoskeletal ROS (+)   Abdominal   Peds negative pediatric ROS (+)  Hematology negative hematology ROS (+)   Anesthesia Other Findings   Reproductive/Obstetrics negative OB ROS                             Anesthesia Physical Anesthesia Plan  ASA: III  Anesthesia Plan: MAC   Post-op Pain Management:    Induction: Intravenous  Airway Management Planned: Nasal Cannula  Additional Equipment:   Intra-op Plan:   Post-operative Plan: Extubation in OR  Informed Consent: I have reviewed the patients History and Physical, chart, labs and discussed the procedure including the risks, benefits and alternatives for the proposed anesthesia with the patient or authorized representative who has indicated his/her understanding and acceptance.   Dental advisory given  Plan Discussed with: CRNA and Surgeon  Anesthesia Plan Comments:         Anesthesia Quick Evaluation

## 2015-05-03 ENCOUNTER — Encounter (HOSPITAL_COMMUNITY): Payer: Self-pay | Admitting: Gastroenterology

## 2015-05-07 ENCOUNTER — Encounter (HOSPITAL_COMMUNITY): Payer: Self-pay

## 2015-05-07 ENCOUNTER — Encounter (HOSPITAL_COMMUNITY)
Admission: RE | Admit: 2015-05-07 | Discharge: 2015-05-07 | Disposition: A | Discharge status: Home / Self Care | Payer: 59 | Source: Ambulatory Visit | Attending: Obstetrics & Gynecology | Admitting: Obstetrics & Gynecology

## 2015-05-07 DIAGNOSIS — Z01812 Encounter for preprocedural laboratory examination: Secondary | ICD-10-CM | POA: Insufficient documentation

## 2015-05-07 LAB — CBC
HEMATOCRIT: 35.7 % — AB (ref 36.0–46.0)
Hemoglobin: 11.5 g/dL — ABNORMAL LOW (ref 12.0–15.0)
MCH: 27.3 pg (ref 26.0–34.0)
MCHC: 32.2 g/dL (ref 30.0–36.0)
MCV: 84.8 fL (ref 78.0–100.0)
Platelets: 319 10*3/uL (ref 150–400)
RBC: 4.21 MIL/uL (ref 3.87–5.11)
RDW: 15.5 % (ref 11.5–15.5)
WBC: 5.9 10*3/uL (ref 4.0–10.5)

## 2015-05-07 NOTE — Patient Instructions (Signed)
Your procedure is scheduled on:  Monday, May 21, 2015  Enter through the Main Entrance of [facility] at:  6:00 AM  Pick up the phone at the desk and dial [phone].  Call this number if you have problems the morning of surgery: [phone].  Remember:  Do NOT eat food or drink after:  Midnight Sunday  Take these medicines the morning of surgery with a SIP OF WATER:  Levothyroxine  Do NOT wear jewelry (body piercing), metal hair clips/bobby pins, make-up, or nail polish. Do NOT wear lotions, powders, or perfumes.  You may wear deodorant. Do NOT shave for 48 hours prior to surgery. Do NOT bring valuables to the hospital. Contacts, dentures, or bridgework may not be worn into surgery.  Leave suitcase in car.  After surgery it may be brought to your room.  For patients admitted to the hospital, checkout time is 11:00 AM the day of discharge.

## 2015-05-14 DIAGNOSIS — Z1504 Genetic susceptibility to malignant neoplasm of endometrium: Secondary | ICD-10-CM | POA: Insufficient documentation

## 2015-05-14 DIAGNOSIS — Z1502 Genetic susceptibility to malignant neoplasm of ovary: Secondary | ICD-10-CM | POA: Insufficient documentation

## 2015-05-14 MED FILL — LEVOTHYROXINE 100 MCG TAB: 100 | 30 days supply | Qty: 30 | Fill #1

## 2015-05-14 MED FILL — HYDROCODON-APAP 5-325: 5-325 | 3 days supply | Qty: 30 | Fill #0

## 2015-05-20 MED ORDER — DEXTROSE 5 % IV SOLN
2.0000 g | INTRAVENOUS | Status: AC
  Administered 2015-05-21: 2 g via INTRAVENOUS
  Filled 2015-05-20: qty 2

## 2015-05-21 ENCOUNTER — Ambulatory Visit (HOSPITAL_COMMUNITY): Payer: 59 | Admitting: Anesthesiology

## 2015-05-21 ENCOUNTER — Encounter (HOSPITAL_COMMUNITY)
Admission: RE | Disposition: A | Discharge status: Home / Self Care | Payer: Self-pay | Source: Ambulatory Visit | Attending: Obstetrics & Gynecology

## 2015-05-21 ENCOUNTER — Encounter (HOSPITAL_COMMUNITY): Payer: Self-pay | Admitting: *Deleted

## 2015-05-21 ENCOUNTER — Ambulatory Visit (HOSPITAL_COMMUNITY)
Admission: RE | Admit: 2015-05-21 | Discharge: 2015-05-22 | Disposition: A | Discharge status: Home / Self Care | Payer: 59 | Source: Ambulatory Visit | Attending: Obstetrics & Gynecology | Admitting: Obstetrics & Gynecology

## 2015-05-21 DIAGNOSIS — E669 Obesity, unspecified: Secondary | ICD-10-CM | POA: Insufficient documentation

## 2015-05-21 DIAGNOSIS — Z1509 Genetic susceptibility to other malignant neoplasm: Secondary | ICD-10-CM | POA: Diagnosis not present

## 2015-05-21 DIAGNOSIS — Z4009 Encounter for prophylactic removal of other organ: Secondary | ICD-10-CM | POA: Insufficient documentation

## 2015-05-21 DIAGNOSIS — Z1504 Genetic susceptibility to malignant neoplasm of endometrium: Secondary | ICD-10-CM | POA: Diagnosis not present

## 2015-05-21 DIAGNOSIS — Z951 Presence of aortocoronary bypass graft: Secondary | ICD-10-CM | POA: Diagnosis not present

## 2015-05-21 DIAGNOSIS — E039 Hypothyroidism, unspecified: Secondary | ICD-10-CM | POA: Diagnosis not present

## 2015-05-21 DIAGNOSIS — K219 Gastro-esophageal reflux disease without esophagitis: Secondary | ICD-10-CM | POA: Diagnosis not present

## 2015-05-21 DIAGNOSIS — N838 Other noninflammatory disorders of ovary, fallopian tube and broad ligament: Secondary | ICD-10-CM | POA: Diagnosis not present

## 2015-05-21 DIAGNOSIS — Z800 Family history of malignant neoplasm of digestive organs: Secondary | ICD-10-CM | POA: Insufficient documentation

## 2015-05-21 DIAGNOSIS — N72 Inflammatory disease of cervix uteri: Secondary | ICD-10-CM | POA: Diagnosis not present

## 2015-05-21 DIAGNOSIS — Z8481 Family history of carrier of genetic disease: Secondary | ICD-10-CM | POA: Diagnosis not present

## 2015-05-21 HISTORY — PX: LAPAROSCOPIC BILATERAL SALPINGO OOPHERECTOMY: SHX5890

## 2015-05-21 HISTORY — PX: LAPAROSCOPIC HYSTERECTOMY: SHX1926

## 2015-05-21 HISTORY — PX: CYSTO: SHX6284

## 2015-05-21 LAB — HCG, SERUM, QUALITATIVE: PREG SERUM: NEGATIVE

## 2015-05-21 LAB — HEMOGLOBIN: Hemoglobin: 11.9 g/dL — ABNORMAL LOW (ref 12.0–15.0)

## 2015-05-21 SURGERY — HYSTERECTOMY, TOTAL, LAPAROSCOPIC
Anesthesia: General | Site: Bladder

## 2015-05-21 MED ORDER — SCOPOLAMINE 1 MG/3DAYS TD PT72
MEDICATED_PATCH | TRANSDERMAL | Status: AC
  Administered 2015-05-21: 1.5 mg via TRANSDERMAL
  Filled 2015-05-21: qty 1

## 2015-05-21 MED ORDER — HYDROMORPHONE HCL 1 MG/ML IJ SOLN
INTRAMUSCULAR | Status: AC
  Administered 2015-05-21: 0.5 mg via INTRAVENOUS
  Filled 2015-05-21: qty 1

## 2015-05-21 MED ORDER — MORPHINE SULFATE (PF) 4 MG/ML IV SOLN: 1.0000 mg | INTRAVENOUS | Status: DC | PRN

## 2015-05-21 MED ORDER — LEVOTHYROXINE SODIUM 100 MCG PO TABS
100.0000 ug | ORAL_TABLET | Freq: Every day | ORAL | Status: DC
  Administered 2015-05-22: 100 ug via ORAL
  Filled 2015-05-21: qty 1

## 2015-05-21 MED ORDER — LIDOCAINE HCL (CARDIAC) 20 MG/ML IV SOLN
INTRAVENOUS | Status: DC | PRN
  Administered 2015-05-21: 50 mg via INTRAVENOUS

## 2015-05-21 MED ORDER — ROPIVACAINE HCL 5 MG/ML IJ SOLN
INTRAMUSCULAR | Status: AC
  Filled 2015-05-21: qty 30

## 2015-05-21 MED ORDER — DEXAMETHASONE SODIUM PHOSPHATE 10 MG/ML IJ SOLN
INTRAMUSCULAR | Status: DC | PRN
  Administered 2015-05-21: 4 mg via INTRAVENOUS

## 2015-05-21 MED ORDER — SIMETHICONE 80 MG PO CHEW: 80.0000 mg | CHEWABLE_TABLET | Freq: Four times a day (QID) | ORAL | Status: DC | PRN

## 2015-05-21 MED ORDER — ACETAMINOPHEN 325 MG PO TABS: 650.0000 mg | ORAL_TABLET | ORAL | Status: DC | PRN

## 2015-05-21 MED ORDER — SODIUM CHLORIDE 0.9 % IJ SOLN
INTRAMUSCULAR | Status: DC | PRN
  Administered 2015-05-21: 10 mL

## 2015-05-21 MED ORDER — KETOROLAC TROMETHAMINE 30 MG/ML IJ SOLN
30.0000 mg | Freq: Four times a day (QID) | INTRAMUSCULAR | Status: DC
  Administered 2015-05-21 – 2015-05-22 (×3): 30 mg via INTRAVENOUS
  Filled 2015-05-21 (×3): qty 1

## 2015-05-21 MED ORDER — SCOPOLAMINE 1 MG/3DAYS TD PT72
1.0000 | MEDICATED_PATCH | Freq: Once | TRANSDERMAL | Status: DC
  Administered 2015-05-21: 1.5 mg via TRANSDERMAL

## 2015-05-21 MED ORDER — STERILE WATER FOR IRRIGATION IR SOLN
Status: DC | PRN
  Administered 2015-05-21: 1000 mL

## 2015-05-21 MED ORDER — MEPERIDINE HCL 25 MG/ML IJ SOLN: 6.2500 mg | INTRAMUSCULAR | Status: DC | PRN

## 2015-05-21 MED ORDER — ONDANSETRON HCL 4 MG/2ML IJ SOLN
INTRAMUSCULAR | Status: DC | PRN
Start: 2015-05-21 — End: 2015-05-21
  Administered 2015-05-21: 4 mg via INTRAVENOUS

## 2015-05-21 MED ORDER — ALUM & MAG HYDROXIDE-SIMETH 200-200-20 MG/5ML PO SUSP: 30.0000 mL | ORAL | Status: DC | PRN

## 2015-05-21 MED ORDER — PROMETHAZINE HCL 25 MG/ML IJ SOLN: 6.2500 mg | INTRAMUSCULAR | Status: DC | PRN

## 2015-05-21 MED ORDER — PROPOFOL 10 MG/ML IV BOLUS
INTRAVENOUS | Status: DC | PRN
  Administered 2015-05-21: 200 mg via INTRAVENOUS

## 2015-05-21 MED ORDER — LIDOCAINE HCL (CARDIAC) 20 MG/ML IV SOLN
INTRAVENOUS | Status: AC
  Filled 2015-05-21: qty 5

## 2015-05-21 MED ORDER — PROPOFOL 10 MG/ML IV BOLUS
INTRAVENOUS | Status: AC
  Filled 2015-05-21: qty 20

## 2015-05-21 MED ORDER — METOCLOPRAMIDE HCL 5 MG/ML IJ SOLN
INTRAMUSCULAR | Status: DC | PRN
  Administered 2015-05-21: 10 mg via INTRAVENOUS

## 2015-05-21 MED ORDER — ROCURONIUM BROMIDE 100 MG/10ML IV SOLN
INTRAVENOUS | Status: DC | PRN
  Administered 2015-05-21: 10 mg via INTRAVENOUS
  Administered 2015-05-21: 20 mg via INTRAVENOUS
  Administered 2015-05-21: 50 mg via INTRAVENOUS

## 2015-05-21 MED ORDER — METOCLOPRAMIDE HCL 5 MG/ML IJ SOLN
INTRAMUSCULAR | Status: AC
  Filled 2015-05-21: qty 2

## 2015-05-21 MED ORDER — KETOROLAC TROMETHAMINE 30 MG/ML IJ SOLN
INTRAMUSCULAR | Status: DC | PRN
  Administered 2015-05-21: 30 mg via INTRAVENOUS

## 2015-05-21 MED ORDER — LIDOCAINE-EPINEPHRINE 1 %-1:100000 IJ SOLN
INTRAMUSCULAR | Status: AC
  Filled 2015-05-21: qty 1

## 2015-05-21 MED ORDER — SODIUM CHLORIDE 0.9 % IJ SOLN
INTRAMUSCULAR | Status: AC
  Filled 2015-05-21: qty 50

## 2015-05-21 MED ORDER — FENTANYL CITRATE (PF) 250 MCG/5ML IJ SOLN
INTRAMUSCULAR | Status: AC
  Filled 2015-05-21: qty 5

## 2015-05-21 MED ORDER — MIDAZOLAM HCL 2 MG/2ML IJ SOLN
INTRAMUSCULAR | Status: DC | PRN
  Administered 2015-05-21: 2 mg via INTRAVENOUS

## 2015-05-21 MED ORDER — DEXTROSE-NACL 5-0.45 % IV SOLN
INTRAVENOUS | Status: DC
  Administered 2015-05-21: 16:00:00 via INTRAVENOUS

## 2015-05-21 MED ORDER — DEXAMETHASONE SODIUM PHOSPHATE 4 MG/ML IJ SOLN
INTRAMUSCULAR | Status: AC
  Filled 2015-05-21: qty 1

## 2015-05-21 MED ORDER — ONDANSETRON HCL 4 MG/2ML IJ SOLN: 4.0000 mg | Freq: Once | INTRAMUSCULAR | Status: DC | PRN

## 2015-05-21 MED ORDER — FENTANYL CITRATE (PF) 100 MCG/2ML IJ SOLN
INTRAMUSCULAR | Status: DC | PRN
  Administered 2015-05-21: 100 ug via INTRAVENOUS
  Administered 2015-05-21: 50 ug via INTRAVENOUS
  Administered 2015-05-21 (×2): 100 ug via INTRAVENOUS
  Administered 2015-05-21: 50 ug via INTRAVENOUS

## 2015-05-21 MED ORDER — SODIUM CHLORIDE 0.9 % IV SOLN
INTRAVENOUS | Status: DC | PRN
  Administered 2015-05-21: 11 mL
  Administered 2015-05-21: 39 mL
  Administered 2015-05-21: 10 mL

## 2015-05-21 MED ORDER — SUGAMMADEX SODIUM 200 MG/2ML IV SOLN
INTRAVENOUS | Status: DC | PRN
  Administered 2015-05-21: 200 mg via INTRAVENOUS

## 2015-05-21 MED ORDER — PANTOPRAZOLE SODIUM 40 MG IV SOLR
40.0000 mg | Freq: Every day | INTRAVENOUS | Status: DC
  Administered 2015-05-21: 40 mg via INTRAVENOUS
  Filled 2015-05-21: qty 40

## 2015-05-21 MED ORDER — BUPIVACAINE HCL (PF) 0.25 % IJ SOLN
INTRAMUSCULAR | Status: AC
  Filled 2015-05-21: qty 30

## 2015-05-21 MED ORDER — ONDANSETRON HCL 4 MG/2ML IJ SOLN
INTRAMUSCULAR | Status: AC
  Filled 2015-05-21: qty 2

## 2015-05-21 MED ORDER — ROCURONIUM BROMIDE 100 MG/10ML IV SOLN
INTRAVENOUS | Status: AC
  Filled 2015-05-21: qty 1

## 2015-05-21 MED ORDER — KETOROLAC TROMETHAMINE 30 MG/ML IJ SOLN: 30.0000 mg | Freq: Four times a day (QID) | INTRAMUSCULAR | Status: DC

## 2015-05-21 MED ORDER — GLYCOPYRROLATE 0.2 MG/ML IJ SOLN
INTRAMUSCULAR | Status: DC | PRN
  Administered 2015-05-21: 0.2 mg via INTRAVENOUS

## 2015-05-21 MED ORDER — OXYCODONE-ACETAMINOPHEN 5-325 MG PO TABS
1.0000 | ORAL_TABLET | ORAL | Status: DC | PRN
  Administered 2015-05-21: 1 via ORAL
  Filled 2015-05-21: qty 1

## 2015-05-21 MED ORDER — HYDROMORPHONE HCL 1 MG/ML IJ SOLN
0.2500 mg | INTRAMUSCULAR | Status: DC | PRN
  Administered 2015-05-21 (×2): 0.5 mg via INTRAVENOUS

## 2015-05-21 MED ORDER — MIDAZOLAM HCL 2 MG/2ML IJ SOLN
INTRAMUSCULAR | Status: AC
  Filled 2015-05-21: qty 2

## 2015-05-21 MED ORDER — LACTATED RINGERS IV SOLN
INTRAVENOUS | Status: DC
  Administered 2015-05-21: 07:00:00 via INTRAVENOUS

## 2015-05-21 MED ORDER — MENTHOL 3 MG MT LOZG
1.0000 | LOZENGE | OROMUCOSAL | Status: DC | PRN
  Administered 2015-05-21: 3 mg via ORAL
  Filled 2015-05-21: qty 9

## 2015-05-21 SURGICAL SUPPLY — 69 items
BENZOIN TINCTURE PRP APPL 2/3 (GAUZE/BANDAGES/DRESSINGS) IMPLANT
BLADE SURG 15 STRL LF C SS BP (BLADE) IMPLANT
BLADE SURG 15 STRL SS (BLADE)
CABLE HIGH FREQUENCY MONO STRZ (ELECTRODE) ×5 IMPLANT
CATH ROBINSON RED A/P 16FR (CATHETERS) IMPLANT
CHLORAPREP W/TINT 26ML (MISCELLANEOUS) ×5 IMPLANT
CLOSURE WOUND 1/2 X4 (GAUZE/BANDAGES/DRESSINGS)
CLOTH BEACON ORANGE TIMEOUT ST (SAFETY) ×5 IMPLANT
COVER BACK TABLE 60X90IN (DRAPES) ×5 IMPLANT
COVER LIGHT HANDLE  1/PK (MISCELLANEOUS) ×2
COVER LIGHT HANDLE 1/PK (MISCELLANEOUS) ×3 IMPLANT
DISSECTOR BLUNT TIP ENDO 5MM (MISCELLANEOUS) IMPLANT
DRSG COVADERM PLUS 2X2 (GAUZE/BANDAGES/DRESSINGS) ×15 IMPLANT
DRSG OPSITE POSTOP 3X4 (GAUZE/BANDAGES/DRESSINGS) ×5 IMPLANT
DURAPREP 26ML APPLICATOR (WOUND CARE) ×5 IMPLANT
EVACUATOR SMOKE 8.L (FILTER) ×10 IMPLANT
FORCEPS CUTTING 33CM 5MM (CUTTING FORCEPS) IMPLANT
GLOVE BIOGEL PI IND STRL 7.0 (GLOVE) ×15 IMPLANT
GLOVE BIOGEL PI INDICATOR 7.0 (GLOVE) ×10
GLOVE ECLIPSE 6.5 STRL STRAW (GLOVE) ×10 IMPLANT
GOWN STRL REUS W/TWL LRG LVL3 (GOWN DISPOSABLE) ×5 IMPLANT
LIGASURE BLUNT 5MM 37CM (INSTRUMENTS) ×5 IMPLANT
LIQUID BAND (GAUZE/BANDAGES/DRESSINGS) ×5 IMPLANT
NEEDLE INSUFFLATION 120MM (ENDOMECHANICALS) ×5 IMPLANT
NS IRRIG 1000ML POUR BTL (IV SOLUTION) ×5 IMPLANT
OCCLUDER COLPOPNEUMO (BALLOONS) ×5 IMPLANT
PACK LAPAROSCOPY BASIN (CUSTOM PROCEDURE TRAY) ×5 IMPLANT
PACK ROBOTIC GOWN (GOWN DISPOSABLE) ×5 IMPLANT
PAD TRENDELENBURG POSITION (MISCELLANEOUS) ×5 IMPLANT
POUCH LAPAROSCOPIC INSTRUMENT (MISCELLANEOUS) ×5 IMPLANT
POUCH SPECIMEN RETRIEVAL 10MM (ENDOMECHANICALS) IMPLANT
SCISSORS LAP 5X35 DISP (ENDOMECHANICALS) IMPLANT
SEALER TISSUE G2 CVD JAW 35 (ENDOMECHANICALS) ×3 IMPLANT
SEALER TISSUE G2 CVD JAW 45CM (ENDOMECHANICALS) ×2
SET CYSTO W/LG BORE CLAMP LF (SET/KITS/TRAYS/PACK) IMPLANT
SET IRRIG TUBING LAPAROSCOPIC (IRRIGATION / IRRIGATOR) ×5 IMPLANT
SET TRI-LUMEN FLTR TB AIRSEAL (TUBING) IMPLANT
SHEARS HARMONIC ACE PLUS 36CM (ENDOMECHANICALS) ×5 IMPLANT
SLEEVE XCEL OPT CAN 5 100 (ENDOMECHANICALS) ×5 IMPLANT
SOLUTION ELECTROLUBE (MISCELLANEOUS) IMPLANT
SPATULA 33CM PLASMA (CUTTING FORCEPS) IMPLANT
STRIP CLOSURE SKIN 1/2X4 (GAUZE/BANDAGES/DRESSINGS) IMPLANT
SUT DVC VLOC 180 2-0 12IN GS21 (SUTURE)
SUT VIC AB 0 CT1 27 (SUTURE) ×4
SUT VIC AB 0 CT1 27XBRD ANBCTR (SUTURE) ×6 IMPLANT
SUT VIC AB 0 CT1 36 (SUTURE) IMPLANT
SUT VIC AB 3-0 PS2 18 (SUTURE)
SUT VIC AB 3-0 PS2 18XBRD (SUTURE) IMPLANT
SUT VICRYL 0 UR6 27IN ABS (SUTURE) IMPLANT
SUT VICRYL 4-0 PS2 18IN ABS (SUTURE) ×5 IMPLANT
SUTURE DVC VL 180 2-0 12INGS21 (SUTURE) IMPLANT
SYR 30ML LL (SYRINGE) IMPLANT
SYR 50ML LL SCALE MARK (SYRINGE) ×5 IMPLANT
SYSTEM CARTER THOMASON II (TROCAR) ×5 IMPLANT
TIP UTERINE 5.1X6CM LAV DISP (MISCELLANEOUS) IMPLANT
TIP UTERINE 6.7X10CM GRN DISP (MISCELLANEOUS) IMPLANT
TIP UTERINE 6.7X6CM WHT DISP (MISCELLANEOUS) IMPLANT
TIP UTERINE 6.7X8CM BLUE DISP (MISCELLANEOUS) IMPLANT
TOWEL OR 17X24 6PK STRL BLUE (TOWEL DISPOSABLE) ×10 IMPLANT
TRAY FOLEY CATH SILVER 14FR (SET/KITS/TRAYS/PACK) ×5 IMPLANT
TROCAR ADV FIXATION 11X100MM (TROCAR) ×5 IMPLANT
TROCAR ADV FIXATION 5X100MM (TROCAR) ×10 IMPLANT
TROCAR BALLN 12MMX100 BLUNT (TROCAR) IMPLANT
TROCAR PORT AIRSEAL 5X120 (TROCAR) IMPLANT
TROCAR PORT AIRSEAL 8X120 (TROCAR) IMPLANT
TROCAR XCEL NON-BLD 11X100MML (ENDOMECHANICALS) IMPLANT
TROCAR XCEL NON-BLD 5MMX100MML (ENDOMECHANICALS) ×5 IMPLANT
WARMER LAPAROSCOPE (MISCELLANEOUS) ×5 IMPLANT
WATER STERILE IRR 1000ML POUR (IV SOLUTION) ×5 IMPLANT

## 2015-05-21 NOTE — Anesthesia Preprocedure Evaluation (Signed)
Anesthesia Evaluation  Patient identified by MRN, date of birth, ID band Patient awake    Reviewed: Allergy & Precautions, NPO status , Patient's Chart, lab work & pertinent test results  Airway Mallampati: I  TM Distance: >3 FB Neck ROM: Full    Dental   Pulmonary    Pulmonary exam normal        Cardiovascular + CABG  Normal cardiovascular exam     Neuro/Psych Depression    GI/Hepatic GERD  Medicated and Controlled,  Endo/Other    Renal/GU      Musculoskeletal   Abdominal   Peds  Hematology   Anesthesia Other Findings   Reproductive/Obstetrics                             Anesthesia Physical Anesthesia Plan  ASA: II  Anesthesia Plan: General   Post-op Pain Management:    Induction: Intravenous  Airway Management Planned: Oral ETT  Additional Equipment:   Intra-op Plan:   Post-operative Plan: Extubation in OR  Informed Consent: I have reviewed the patients History and Physical, chart, labs and discussed the procedure including the risks, benefits and alternatives for the proposed anesthesia with the patient or authorized representative who has indicated his/her understanding and acceptance.     Plan Discussed with: CRNA and Surgeon  Anesthesia Plan Comments:         Anesthesia Quick Evaluation

## 2015-05-21 NOTE — Addendum Note (Signed)
Addendum  created 05/21/15 1745 by Genevie Ann, CRNA   Modules edited: Clinical Notes   Clinical Notes:  File: [id number]

## 2015-05-21 NOTE — Anesthesia Postprocedure Evaluation (Signed)
Anesthesia Post Note  Patient: Grace Graves  Procedure(s) Performed: Procedure(s) (LRB): HYSTERECTOMY TOTAL LAPAROSCOPIC (N/A) LAPAROSCOPIC BILATERAL SALPINGO OOPHORECTOMY (Bilateral) CYSTO (N/A)  Patient location during evaluation: [facility] Anesthesia Type: General Level of consciousness: awake and alert Pain management: pain level controlled Vital Signs Assessment: post-procedure vital signs reviewed and stable Respiratory status: spontaneous breathing Cardiovascular status: blood pressure returned to baseline Postop Assessment: adequate PO intake and no signs of nausea or vomiting Anesthetic complications: no    Last Vitals:  Filed Vitals:   05/21/15 1220 05/21/15 1359  BP: 131/65 119/54  Pulse: 73 71  Temp: 36.6 C 36.8 C  Resp: 16 16    Last Pain:  Filed Vitals:   05/21/15 1404  PainSc: [place], [employer]

## 2015-05-21 NOTE — H&P (Signed)
Grace Graves is an 45 y.o. female [phone] DWF here for total laparoscopic hysterectomy and BSO due to [facility] syndrome with MSH6 genetic abnormality.  Pt has decided to proceed with hysterectomy and BSO due to recommendations for cancer risk reduction with this genetic abnormality.  Risks and benefits have been discussed.  Pt is here and ready to proceed.    Pertinent Gynecological History: Menses: normal Bleeding: normal Contraception: none DES exposure: denies Blood transfusions: none Sexually transmitted diseases: no past history Previous GYN Procedures: none  Last mammogram: normal Date: 04/27/15 Last pap: normal Date: 2/17 OB History: G4, P3   Menstrual History:  No LMP recorded.    Past Medical History  Diagnosis Date  . AMA (advanced maternal age) multigravida 35+   . H/O varicella   . Abnormal Pap smear     ASCUS-H +HPV 11/11, 8/12  [place], 5/13 ASCUS HPV-  . Depression     pp depression x2  - no problems at present  . GERD (gastroesophageal reflux disease)   . Gallstones     3 or 4 gallbladder attacks - with abdominal pain, nausea and vomiting and reflux  . Complication of anesthesia 12 yrs ago    woke up in middle of anesthesia for eye surgery  . Obese   . FHx: colon cancer   . Depression, postpartum   . Colon polyps   . Hypothyroidism   . 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  . Vaginal delivery 2003, 2008, 2014    Past Surgical History  Procedure Laterality Date  . Tonsillectomy    . Eye surgery      L eye  . Laparoscopy      for ovarian cyst  . Cholecystectomy N/A 04/18/2013    Procedure: LAPAROSCOPIC CHOLECYSTECTOMY WITH INTRAOPERATIVE CHOLANGIOGRAM;  Surgeon: Odis Hollingshead, MD;  Location: [facility] ORS;  Service: General;  Laterality: N/A;  . Colposcopy      2/12,8/12 HPV  changes only  . Esophagogastroduodenoscopy (egd) with propofol N/A 05/02/2015    Procedure: ESOPHAGOGASTRODUODENOSCOPY (EGD) WITH  PROPOFOL;  Surgeon: Arta Silence, MD;  Location: [facility] ENDOSCOPY;  Service: Endoscopy;  Laterality: N/A;  . Colonoscopy with propofol N/A 05/02/2015    Procedure: COLONOSCOPY WITH PROPOFOL;  Surgeon: Arta Silence, MD;  Location: [facility] ENDOSCOPY;  Service: Endoscopy;  Laterality: N/A;  . Mouth surgery      Family History  Problem Relation Age of Onset  . Arthritis Mother   . COPD Mother   . Cancer Mother     colon and lung  . Depression Mother   . Diabetes Mother   . Heart disease Mother   . Hyperlipidemia Mother   . Hypertension Mother   . Hypertension Father   . Depression Father   . Depression Sister   . Kidney disease Sister   . Hypothyroidism Sister   . Other Sister     enlarged heart  . Fibromyalgia Sister   . Cancer Maternal Aunt     NOS  . Melanoma Paternal Uncle   . SIDS Brother     Social History:  reports that she has never smoked. She has never used smokeless tobacco. She reports that she drinks alcohol. She reports that she does not use illicit drugs.  Allergies: No Known Allergies  Prescriptions prior to admission  Medication Sig Dispense Refill Last Dose  . levothyroxine (SYNTHROID, LEVOTHROID) 100 MCG tablet Take 100 mcg by mouth daily.  1 05/21/2015 at  Unknown time  . Multiple Vitamin (MULTIVITAMIN) tablet Take 1 tablet by mouth daily.   05/20/2015 at Unknown time  . NUVARING 0.12-0.015 MG/24HR vaginal ring INSERT VAGINALLY AND LEAVE IN PLACE FOR 3 CONSECUTIVE WEEKS, THEN REMOVE FOR 1 WEEK. 1 each 1 05/21/2015 at Unknown time    Review of Systems  All other systems reviewed and are negative.   Blood pressure 135/85, pulse 85, temperature 98.1 F (36.7 C), temperature source Oral, resp. rate 16, SpO2 99 %. Physical Exam  Constitutional: She is oriented to person, place, and time. She appears well-developed and well-nourished.  Cardiovascular: Normal rate and regular rhythm.   Respiratory: Effort normal and breath sounds normal.  Neurological: She is alert and  oriented to person, place, and time.  Skin: Skin is warm and dry.  Psychiatric: She has a normal mood and affect.    Results for orders placed or performed during the hospital encounter of 05/21/15 (from the past 24 hour(s))  hCG, serum, qualitative     Status: None   Collection Time: 05/21/15  6:10 AM  Result Value Ref Range   Preg, Serum NEGATIVE NEGATIVE    No results found.  Assessment/Plan: 45 yo G4P3 DWF here for TLH/BSO due to Lynch syndrome MSH6 genetic abnormality.  Pt here and ready to proceed.  All questions answered.  Grace Graves 05/21/2015, 7:03 AM

## 2015-05-21 NOTE — OR Nursing (Signed)
Pt ready to go to room-in holding pattern for last 30 minutes.Allenmichael Mcpartlin rn

## 2015-05-21 NOTE — Op Note (Signed)
05/21/2015  9:48 AM  PATIENT:  Grace Graves  45 y.o. 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 Sumner Boast   ANESTHESIA:   general  ESTIMATED BLOOD LOSS: 150cc  BLOOD ADMINISTERED:none   FLUIDS: 1800ccLR  UOP: 150cc clear UOP  SPECIMEN:  Uterus, cervix, bilateral tubes and ovaries, urine cytology  DISPOSITION OF SPECIMEN:  PATHOLOGY  FINDINGS: normal pelvis, normal uterus and ovaries, normal upper abdomen, possible small area of endometriosis on lower uterine segment (sent with pathology specimen).  DESCRIPTION OF OPERATION: Patient is taken to the operating room. She is placed in the supine position. She is a running IV in place. Informed consent was present on the chart. SCDs on her lower extremities and functioning properly. General endotracheal anesthesia was administered by the anesthesia staff without difficulty. Once adequate anesthesia was confirmed the legs are placed in the low lithotomy position in [place]. Her arms were tucked by the side.   Dura prep was then used to prep the abdomen and Betadine was used to prep the inner thighs, perineum and vagina. Once 3 minutes had past the patient was draped in a normal standard fashion. The legs were lifted to the high lithotomy position. The cervix was visualized by placing a heavy weighted speculum in the posterior aspect of the vagina and using a curved Deaver retractor to the retract anteriorly. The anterior lip of the cervix was grasped with single-tooth tenaculum.  The cervix sounded to 8 cm. Pratt dilators were used to dilate the cervix up to a #21. A RUMI uterine  manipulator was obtained. A #8 disposable tip was placed on the RUMI manipulator as well as a medium, silver KOH ring. This was passed through the cervix and the bulb of the disposable tip was inflated with 10 cc of normal saline. There was a good fit of the KOH ring around the cervix. The tenaculum was removed. There is also good manipulation of the uterus. The speculum and retractor were removed as well. A Foley catheter was placed to straight drain.  Clear urine was noted.  Specimen was sent for urine cytology.  Legs were lowered to the low lithotomy position and attention was turned the abdomen.  The umbilicus was everted.  A Veress needle was obtained. Syringe of sterile saline was placed on a open Veress needle.  This was passed into the umbilicus until just when the fluid started to drip.  Then low flow CO2 gas was attached the needle and the pneumoperitoneum was achieved without difficulty. Once 3.0 liters of gas was in the abdomen the Veress needle was removed and a 5 millimeter non-bladed Optiview trocar and port were passed directly to the abdomen. The laparoscope was then used to confirm intraperitoneal placement.  Pelvis and upper abdomen surveyed.  No abnormal areas noted except for one possible area of endometriosis on the lower uterine segment.  Locations for RLQ and LLQ ports were noted by transillumination of the abdominal wall.  0.25% marcaine was used to anesthetize the skin.  2mm skin incisions were made and then 62mm bladed ports were placed.  Finally a midline incision was made about 4 cm above the pubic symphysis.  The skin was incised about 1cm and a non-bladed 12 port was placed  with direct visualization of the laparoscope.    Ureters were identified.  Attention was turned to the left side. With uterus on stretch the left IP ligament was serially clamped cauterized and incised using the ligasure device. Left round ligament was serially clamped cauterized and incised. The anterior and  posterior peritoneum of the inferior leaf of the broad ligament were opened. The beginning of the baldder flap was created.  The bladder was taken down below the level of the KOH ring. The left uterine artery skeletonized and then just superior to the KOH ring this vessel was serially clamped, cauterized, and incised.  Attention was turned the right side.  The uterus was placed on stretch to the opposite side.  The right IP ligament was serially clamped cauterized and incised. Next the right round ligament was serially clamped cauterized and incised. The anterior posterior peritoneum of the inferiorly for the broad ligament were opened. The anterior peritoneum was carried across to the dissection on the left side. The remainder of the bladder flap was created using sharp dissection. The bladder was well below the level of the KOH ring. The left uterine artery skeletonized. Then the left uterine artery, above the level of the KOH ring, was serially clamped cauterized and incised. The uterus was devascularized at this point.  The colpotomy was performed a starting in the midline and using a harmonic scalpel with the inferior edge of the open blade  This was carried around a circumferential fashion until the vaginal mucosa was completely incised in the specimen was freed.  The specimen was then delivered to the vagina.  A vaginal occlusive device was used to maintain the pneumoperitoneum  Instruments were changed with a needle driver and Cobra grasper.  Using a 9 inch V. lock suture, the cuff was closed by incorporating the anterior and posterior vaginal mucosa in each stitch. This was carried across all the way to the left corner and a running fashion. Two stitches were brought back towards the midline and the suture was cut flush with the vagina. The needle was brought out the pelvis. The pelvis was irrigated. All pedicles were inspected. No bleeding was noted.  Pneumoperitoneum was relived to ensure no  bleeding.  At this point the procedure was completed. The largest port was closed with a fascial closure device after the port was removed.  Suture was tied and excellent closure of the fascia was noted.  The remaining instruments were removed.  The RLQ and LLQ ports were removed under direct visualization of the laparoscope and the pneumoperitoneum was relieved.  The patient was taken out of Trendelenburg positioning.  Several deep breaths were given to the patient's trying to any gas the abdomen and finally the midline port was removed.  The skin was then closed with subcuticular stitches of 3-0 Vicryl. The skin was cleansed Dermabond was applied. Attention was then turned the vagina and the cuff was inspected. No bleeding was noted. The anterior posterior vaginal mucosa was incorporated in each stitch. The Foley catheter was removed.  Cystoscopy was performed.  No sutures or bladder injuries were noted.  Ureters were noted with normal urine jets from each one was seen.  Foley was replaced and the cystoscopic fluid was drained.  Sponge, lap, needle, initially counts were correct x2. Patient tolerated the procedure very well.  She was awakened from anesthesia, extubated and taken to recovery in stable condition.    COUNTS:  YES  PLAN OF CARE: Transfer to PACU

## 2015-05-21 NOTE — Progress Notes (Signed)
Day of Surgery Procedure(s) (LRB): HYSTERECTOMY TOTAL LAPAROSCOPIC (N/A) LAPAROSCOPIC BILATERAL SALPINGO OOPHORECTOMY (Bilateral) CYSTO (N/A)  Subjective: Patient reports good pain control, no nausea, no trouble voiding or walking.  She has tolerated liquid diet without problems.  She is burping but no flatus yet.  Cytology results are negative.  Reviewed with pt.  Objective: I have reviewed patient's vital signs, intake and output, medications and labs. Tm: 98.2  P: 68-95  R: 13-21  BP: 119-131/54-82 UOP: 1850cc  General: alert and cooperative Resp: clear to auscultation bilaterally Cardio: regular rate and rhythm, S1, S2 normal, no murmur, click, rub or gallop GI: soft, non-tender; bowel sounds normal; no masses,  no organomegaly and incision: clean, dry and intact Extremities: extremities normal, atraumatic, no cyanosis or edema Vaginal Bleeding: none  Assessment: s/p Procedure(s): HYSTERECTOMY TOTAL LAPAROSCOPIC (N/A) LAPAROSCOPIC BILATERAL SALPINGO OOPHORECTOMY (Bilateral) CYSTO (N/A): stable and progressing well  Plan: Advance diet Encourage ambulation Cont SCDs CBC in AM     Grace Graves Grace Graves 05/21/2015, 5:38 PM

## 2015-05-21 NOTE — Anesthesia Postprocedure Evaluation (Signed)
Anesthesia Post Note  Patient: Grace Graves  Procedure(s) Performed: Procedure(s) (LRB): HYSTERECTOMY TOTAL LAPAROSCOPIC (N/A) LAPAROSCOPIC BILATERAL SALPINGO OOPHORECTOMY (Bilateral) CYSTO (N/A)  Patient location during evaluation: PACU Anesthesia Type: General Level of consciousness: awake and alert Pain management: pain level controlled Vital Signs Assessment: post-procedure vital signs reviewed and stable Respiratory status: spontaneous breathing, nonlabored ventilation, respiratory function stable and patient connected to nasal cannula oxygen Cardiovascular status: blood pressure returned to baseline and stable Postop Assessment: no signs of nausea or vomiting Anesthetic complications: no    Last Vitals:  Filed Vitals:   05/21/15 1030 05/21/15 1032  BP:  122/74  Pulse: 76 78  Temp:    Resp: 11 14    Last Pain:  Filed Vitals:   05/21/15 1032  PainSc: 6                  Sagal Gayton DAVID

## 2015-05-21 NOTE — Transfer of Care (Signed)
Immediate Anesthesia Transfer of Care Note  Patient: Grace Graves  Procedure(s) Performed: Procedure(s): HYSTERECTOMY TOTAL LAPAROSCOPIC (N/A) LAPAROSCOPIC BILATERAL SALPINGO OOPHORECTOMY (Bilateral) CYSTO (N/A)  Patient Location: PACU  Anesthesia Type:General  Level of Consciousness: awake, alert  and oriented  Airway & Oxygen Therapy: Patient Spontanous Breathing and Patient connected to nasal cannula oxygen  Post-op Assessment: Report given to RN and Post -op Vital signs reviewed and stable  Post vital signs: Reviewed and stable  Last Vitals:  Filed Vitals:   05/21/15 0620  BP: 135/85  Pulse: 85  Temp: 36.7 C  Resp: 16    Complications: No apparent anesthesia complications

## 2015-05-21 NOTE — Anesthesia Procedure Notes (Signed)
Procedure Name: Intubation Date/Time: 05/21/2015 7:40 AM Performed by: Jonna Munro Pre-anesthesia Checklist: Suction available, Emergency Drugs available, Patient identified, Patient being monitored and Timeout performed Patient Re-evaluated:Patient Re-evaluated prior to inductionOxygen Delivery Method: Circle system utilized Preoxygenation: Pre-oxygenation with 100% oxygen Intubation Type: IV induction Ventilation: Mask ventilation without difficulty Laryngoscope Size: Mac and 3 Grade View: Grade I Tube type: Oral Tube size: 7.0 mm Number of attempts: 1 Airway Equipment and Method: Stylet Placement Confirmation: ETT inserted through vocal cords under direct vision,  positive ETCO2 and breath sounds checked- equal and bilateral Secured at: 21 cm Tube secured with: Tape Dental Injury: Teeth and Oropharynx as per pre-operative assessment

## 2015-05-22 ENCOUNTER — Encounter (HOSPITAL_COMMUNITY): Payer: Self-pay | Admitting: Obstetrics & Gynecology

## 2015-05-22 ENCOUNTER — Telehealth: Payer: Self-pay | Admitting: Obstetrics & Gynecology

## 2015-05-22 DIAGNOSIS — K219 Gastro-esophageal reflux disease without esophagitis: Secondary | ICD-10-CM | POA: Diagnosis not present

## 2015-05-22 DIAGNOSIS — Z4009 Encounter for prophylactic removal of other organ: Secondary | ICD-10-CM | POA: Diagnosis not present

## 2015-05-22 DIAGNOSIS — Z1509 Genetic susceptibility to other malignant neoplasm: Secondary | ICD-10-CM | POA: Diagnosis not present

## 2015-05-22 DIAGNOSIS — N838 Other noninflammatory disorders of ovary, fallopian tube and broad ligament: Secondary | ICD-10-CM | POA: Diagnosis not present

## 2015-05-22 DIAGNOSIS — Z800 Family history of malignant neoplasm of digestive organs: Secondary | ICD-10-CM | POA: Diagnosis not present

## 2015-05-22 DIAGNOSIS — Z951 Presence of aortocoronary bypass graft: Secondary | ICD-10-CM | POA: Diagnosis not present

## 2015-05-22 DIAGNOSIS — E669 Obesity, unspecified: Secondary | ICD-10-CM | POA: Diagnosis not present

## 2015-05-22 DIAGNOSIS — E039 Hypothyroidism, unspecified: Secondary | ICD-10-CM | POA: Diagnosis not present

## 2015-05-22 LAB — CBC
HEMATOCRIT: 32.8 % — AB (ref 36.0–46.0)
Hemoglobin: 10.7 g/dL — ABNORMAL LOW (ref 12.0–15.0)
MCH: 27.1 pg (ref 26.0–34.0)
MCHC: 32.6 g/dL (ref 30.0–36.0)
MCV: 83 fL (ref 78.0–100.0)
PLATELETS: 293 10*3/uL (ref 150–400)
RBC: 3.95 MIL/uL (ref 3.87–5.11)
RDW: 15.2 % (ref 11.5–15.5)
WBC: 9.3 10*3/uL (ref 4.0–10.5)

## 2015-05-22 MED ORDER — OXYCODONE-ACETAMINOPHEN 5-325 MG PO TABS: 1.0000 | ORAL_TABLET | ORAL | Status: DC | PRN

## 2015-05-22 MED ORDER — IBUPROFEN 800 MG PO TABS: 800.0000 mg | ORAL_TABLET | Freq: Three times a day (TID) | ORAL | Status: DC | PRN

## 2015-05-22 NOTE — Discharge Instructions (Signed)

## 2015-05-22 NOTE — Progress Notes (Signed)
Pt  Ambulated out   Teaching complete    

## 2015-05-22 NOTE — Telephone Encounter (Signed)
Spoke with patient and confirmed surgery prepayment received.  Patient stated in her conversation that her FMLA forms had been received, but that [employer] [place] has not received Short Term Disability forms.  I explained that our insurance representative is not in the office today and check with our insurance rep and follow up patient on 05/23/15. Routing to [place] for review

## 2015-05-22 NOTE — Discharge Summary (Signed)
Physician Discharge Summary  Patient ID: Grace Graves MRN: [id number] DOB/AGE: 1970-11-15 45 y.o.  Admit date: 05/21/2015 Discharge date: 05/22/2015  Admission Diagnoses: Lynch syndrome with genetic risk for ovarian and uterine cancer Hypothyroidism Obesity  Discharge Diagnoses:  Active Problems:   MSH6-related Lynch syndrome [facility])   Discharged Condition: good  Hospital Course: Patient admitted through same day surgery.  She was taken to OR where TLH/BSO/Cystoscopy were performed.  Surgical findings included normal pelvic and upper abdomen.  Surgery was uneventful.  EBL 150cc.  Foley catheter was removed before leaving OR.  Patient transferred to PACU where she was stable and then to 3rd floor for the remainder of her hospitalization.  During her post-op recovery, her vitals and stable and she was AF.  In evening of POD#0, she was able to transition to oral pain medications and regular diet.  She was able to ambulate and she had good pain control.  She was also able to void on her own.  Patient seen both in the evening of POD#0 and AM of POD#1.  In the AM of POD#1, she was without complaint.  Post op hb was 10.7, decreased from 11.5, pre-operatively.  At this point, patient was voiding, walking, having excellent pain control, had no nausea, and minimal vaginal bleeding.  She was ready for D/C.  Consults: None  Significant Diagnostic Studies: labs: post op hb 10.7 and urine cytology that was negative  Treatments: surgery: TLH/BSO/cystoscopy  Discharge Exam: Blood pressure 116/61, pulse 85, temperature 98.8 F (37.1 C), temperature source Oral, resp. rate 16, height '5\' 8"'$  (1.727 m), weight 260 lb (117.935 kg), SpO2 98 %. General appearance: alert and cooperative Resp: clear to auscultation bilaterally Cardio: regular rate and rhythm, S1, S2 normal, no murmur, click, rub or gallop GI: soft, non-tender; bowel sounds normal; no masses,  no organomegaly Extremities: extremities  normal, atraumatic, no cyanosis or edema Incision/Wound:c/d/i  Disposition: 01-Home or Self Care     Medication List    TAKE these medications        ibuprofen 800 MG tablet  Commonly known as:  ADVIL,MOTRIN  Take 1 tablet (800 mg total) by mouth every 8 (eight) hours as needed.     levothyroxine 100 MCG tablet  Commonly known as:  SYNTHROID, LEVOTHROID  Take 100 mcg by mouth daily.     multivitamin tablet  Take 1 tablet by mouth daily.     oxyCODONE-acetaminophen 5-325 MG tablet  Commonly known as:  PERCOCET/ROXICET  Take 1-2 tablets by mouth every 4 (four) hours as needed for moderate pain or severe pain (moderate to severe pain (when tolerating fluids)).           Follow-up Information    Follow up with Lyman Speller, MD On 05/28/2015.   Specialty:  Gynecology   Why:  appt time 12:45pm   Contact information:   [address] [place] [address] [phone]       Signed: Lyman Speller 05/22/2015, 8:54 AM

## 2015-05-22 NOTE — Progress Notes (Signed)
1 Day Post-Op Procedure(s) (LRB): HYSTERECTOMY TOTAL LAPAROSCOPIC (N/A) LAPAROSCOPIC BILATERAL SALPINGO OOPHORECTOMY (Bilateral) CYSTO (N/A)  Subjective: Patient reports good pain control, no nausea, no trouble voiding.  No flatus but "feeling a lot of rumbling".  Walking without assistance.  IV has been removed.  Took one Percocet last night.  Objective: I have reviewed patient's vital signs, intake and output, medications and labs. Filed Vitals:   05/22/15 0149 05/22/15 0515  BP: 109/52 116/61  Pulse: 65 85  Temp: 98.4 F (36.9 C) 98.8 F (37.1 C)  Resp: 16 16     CBC    Component Value Date/Time   WBC 9.3 05/22/2015 0548   RBC 3.95 05/22/2015 0548   HGB 10.7* 05/22/2015 0548   HCT 32.8* 05/22/2015 0548   PLT 293 05/22/2015 0548   MCV 83.0 05/22/2015 0548   MCH 27.1 05/22/2015 0548   MCHC 32.6 05/22/2015 0548   RDW 15.2 05/22/2015 0548   General: alert and cooperative Resp: clear to auscultation bilaterally Cardio: regular rate and rhythm, S1, S2 normal, no murmur, click, rub or gallop GI: soft, non-tender; bowel sounds normal; no masses,  no organomegaly and incision: clean, dry and intact Extremities: extremities normal, atraumatic, no cyanosis or edema Vaginal Bleeding: minimal  Assessment: s/p Procedure(s): HYSTERECTOMY TOTAL LAPAROSCOPIC (N/A) LAPAROSCOPIC BILATERAL SALPINGO OOPHORECTOMY (Bilateral) CYSTO (N/A): stable and progressing well  Plan: Discharge home     Hale Bogus [facility] 05/22/2015, 8:48 AM

## 2015-05-22 NOTE — Plan of Care (Signed)
Problem: Skin Integrity: Goal: Demonstration of wound healing without infection will improve Outcome: Not Applicable Date Met:  86/76/72 Vaginal drainage discussed

## 2015-05-23 NOTE — Telephone Encounter (Signed)
Spoke with patient, confirmed we have received, completed and had have faxed her short term disability forms to [employer], as of today. Patient had no further questions. Ok to close

## 2015-05-28 ENCOUNTER — Ambulatory Visit (INDEPENDENT_AMBULATORY_CARE_PROVIDER_SITE_OTHER): Payer: 59 | Admitting: Obstetrics & Gynecology

## 2015-05-28 ENCOUNTER — Encounter: Payer: Self-pay | Admitting: Obstetrics & Gynecology

## 2015-05-28 VITALS — BP 120/78 | HR 82 | Resp 16 | Ht 68.0 in | Wt 259.0 lb

## 2015-05-28 DIAGNOSIS — Z1509 Genetic susceptibility to other malignant neoplasm: Secondary | ICD-10-CM

## 2015-05-28 DIAGNOSIS — Z9889 Other specified postprocedural states: Secondary | ICD-10-CM

## 2015-05-28 NOTE — Progress Notes (Signed)
Post Operative Visit  Procedure: HYSTERECTOMY TOTAL LAPAROSCOPIC (N/A Abdomen), LAPAROSCOPIC BILATERAL SALPINGO, OOPHORECTOMY (Bilateral Abdomen)   Days Post-op: 7 days  Subjective: Doing well.  Having an emotional day as a good friend's father died this weekend.  Pt just learned today.  Felt moody on Friday.  Denies hot flashes.  Reviewed pathology with pt.  Denies vaginal bleeding.  Having increased cramping with bowel movements.  Having a daily bowel movement.  Taking fiber and this helps.  Bladder function is normal.    Objective: BP 120/78 mmHg  Pulse 82  Resp 16  Ht 5\' 8"  (1.727 m)  Wt 259 lb (117.482 kg)  BMI 39.39 kg/m2  LMP 04/09/2015  EXAM General: alert and cooperative Resp: clear to auscultation bilaterally Cardio: regular rate and rhythm, S1, S2 normal, no murmur, click, rub or gallop GI: soft, non-tender; bowel sounds normal; no masses,  no organomegaly and incision: clean, dry and intact Extremities: extremities normal, atraumatic, no cyanosis or edema Vaginal Bleeding: none  Assessment: s/p Laparoscopic TLH/BSO/cystoscopy due to Lynch syndrome  Plan: Recheck 3 weeks

## 2015-06-11 DIAGNOSIS — E039 Hypothyroidism, unspecified: Secondary | ICD-10-CM | POA: Diagnosis not present

## 2015-06-13 MED FILL — LEVOTHYROXINE 100 MCG TAB: 100 | 30 days supply | Qty: 30 | Fill #0

## 2015-06-22 ENCOUNTER — Ambulatory Visit: Payer: 59 | Admitting: Obstetrics & Gynecology

## 2015-06-25 ENCOUNTER — Ambulatory Visit (INDEPENDENT_AMBULATORY_CARE_PROVIDER_SITE_OTHER): Payer: 59 | Admitting: Obstetrics & Gynecology

## 2015-06-25 ENCOUNTER — Encounter: Payer: Self-pay | Admitting: Obstetrics & Gynecology

## 2015-06-25 VITALS — BP 106/72 | HR 89 | Resp 16 | Ht 68.0 in | Wt 255.0 lb

## 2015-06-25 DIAGNOSIS — Z9889 Other specified postprocedural states: Secondary | ICD-10-CM

## 2015-06-25 NOTE — Progress Notes (Signed)
Post Operative Visit  Procedure:HYSTERECTOMY TOTAL LAPAROSCOPIC   LAPAROSCOPIC BILATERAL SALPINGO OOPHORECTOMY   CYSTO    Days Post-op: 1 month and 5 days   Subjective: Doing well.  No vaginal bleeding.  Energy is improving.  No pain.  Having a little moodiness.  No hot flashes.  Is really trying to not use HRT.  Pt is going back to work next week.  A little anxious about this due to her fatigue and mood changes.  Knows this is surgically related and due to oophorectomy.  She really does not want to be on medication, if possible.    Objective: LMP 04/09/2015  EXAM General: alert and cooperative Resp: clear to auscultation bilaterally Cardio: regular rate and rhythm, S1, S2 normal, no murmur, click, rub or gallop GI: soft, non-tender; bowel sounds normal; no masses,  no organomegaly and incision: clean, dry and intact Extremities: extremities normal, atraumatic, no cyanosis or edema Vaginal Bleeding: none  Gyn:  NAEFG, no vaginal discharge or bleeding, cuff healing well, no masses or fullness, non tender  Assessment: s/p laparoscopic TLH/BSO/cystoscopy  Plan: Recheck 4 weeks.  Pt desires to be seen on emore time.

## 2015-07-10 ENCOUNTER — Encounter: Payer: Self-pay | Admitting: Obstetrics & Gynecology

## 2015-07-13 MED FILL — LEVOTHYROXINE 100 MCG TAB: 100 | 30 days supply | Qty: 30 | Fill #1

## 2015-08-01 DIAGNOSIS — E039 Hypothyroidism, unspecified: Secondary | ICD-10-CM | POA: Diagnosis not present

## 2015-08-01 DIAGNOSIS — D649 Anemia, unspecified: Secondary | ICD-10-CM | POA: Diagnosis not present

## 2015-08-08 MED FILL — SYNTHROID 88 MCG TABLET: 88 | 30 days supply | Qty: 30 | Fill #0

## 2015-08-09 DIAGNOSIS — R51 Headache: Secondary | ICD-10-CM | POA: Diagnosis not present

## 2015-08-09 DIAGNOSIS — E039 Hypothyroidism, unspecified: Secondary | ICD-10-CM | POA: Diagnosis not present

## 2015-08-13 ENCOUNTER — Other Ambulatory Visit: Payer: Self-pay | Admitting: Family Medicine

## 2015-08-13 DIAGNOSIS — R51 Headache: Principal | ICD-10-CM

## 2015-08-13 DIAGNOSIS — R519 Headache, unspecified: Secondary | ICD-10-CM

## 2015-08-14 ENCOUNTER — Ambulatory Visit (INDEPENDENT_AMBULATORY_CARE_PROVIDER_SITE_OTHER): Payer: 59 | Admitting: Obstetrics & Gynecology

## 2015-08-14 ENCOUNTER — Encounter: Payer: Self-pay | Admitting: Obstetrics & Gynecology

## 2015-08-14 VITALS — BP 122/80 | HR 72 | Resp 16 | Wt 261.0 lb

## 2015-08-14 DIAGNOSIS — Z9889 Other specified postprocedural states: Secondary | ICD-10-CM

## 2015-08-14 NOTE — Progress Notes (Signed)
Post Operative Visit  Procedure: Hysterectomy Total Laparoscopic   Laparoscopic Bilateral Salpingo    Oophorectomy   Cysto Days Post-op: 2 months, 24days  Subjective: Reports she is feeling better.  Reports she thinks she figured out why she was having so many and so frequent HAs.  Pt was changed to levothyroxine and her TSH was very suppressed, essentially making her hypothyroid.  Less than a week ago, she was switched back to the branded Synthroid and back to the 1mcg dosage.  She already feels better and headaches are much less severe.  She has not had any more dizziness since the medication change was made.   Pt reports she doesn't really feel moody.  Significant other states she IS moody but pt states she feels this is due to having a headache for so many weeks. Since the headaches are better, she thinks her significant other will notice a difference.  Crying spells are much less frequent.  She is feeling more emotionally stable and pleased with this.  She is having hot flashes but tolerable.  Does want to talk about non-hormonal therapy.  D/w pt SSRI and/or gabapentin.  Side effects and administration discussed.  Pt thinks she wants to wait and see "if I really need this".  Pt knows to call if would like to start treatment.  Objective: BP 122/80 mmHg  Pulse 72  Resp 16  Wt 261 lb (118.389 kg)  LMP 04/09/2015  EXAM General: alert and cooperative GYN:  NAEFG, vaginal pink and moist, cuff well healed, no masses Extremities: extremities normal, atraumatic, no cyanosis or edema Vaginal Bleeding: none  Assessment: s/p TLH/BSO due to Lynch syndrome Improved emotional lability Stable hot flashes that she feels are managable  Plan: Pt knows to call with any new issues.  She will follow up in one year.

## 2015-08-14 NOTE — Patient Instructions (Signed)
Grace Graves

## 2015-08-20 ENCOUNTER — Other Ambulatory Visit: Payer: 59

## 2015-09-06 MED FILL — SYNTHROID 88 MCG TABLET: 88 | 30 days supply | Qty: 30 | Fill #1

## 2015-09-25 DIAGNOSIS — E039 Hypothyroidism, unspecified: Secondary | ICD-10-CM | POA: Diagnosis not present

## 2015-09-28 MED FILL — SYNTHROID 100 MCG TABLET: 100 | 30 days supply | Qty: 30 | Fill #0

## 2015-10-10 DIAGNOSIS — Z1509 Genetic susceptibility to other malignant neoplasm: Secondary | ICD-10-CM | POA: Diagnosis not present

## 2015-10-10 DIAGNOSIS — E039 Hypothyroidism, unspecified: Secondary | ICD-10-CM | POA: Diagnosis not present

## 2015-10-25 MED FILL — SYNTHROID 100 MCG TABLET: 100 | 30 days supply | Qty: 30 | Fill #1

## 2015-11-26 MED FILL — SYNTHROID 100 MCG TABLET: 100 | 30 days supply | Qty: 30 | Fill #2

## 2015-11-30 DIAGNOSIS — E039 Hypothyroidism, unspecified: Secondary | ICD-10-CM | POA: Diagnosis not present

## 2015-12-03 MED FILL — SYNTHROID 112 MCG TABLET: 112 | 30 days supply | Qty: 30 | Fill #0

## 2016-01-01 MED FILL — SYNTHROID 112 MCG TABLET: 112 | 30 days supply | Qty: 30 | Fill #1

## 2016-02-01 DIAGNOSIS — E039 Hypothyroidism, unspecified: Secondary | ICD-10-CM | POA: Diagnosis not present

## 2016-02-04 MED FILL — SYNTHROID 112 MCG TABLET: 112 | 30 days supply | Qty: 30 | Fill #0

## 2016-03-03 MED FILL — SYNTHROID 112 MCG TABLET: 112 | 30 days supply | Qty: 30 | Fill #1

## 2016-04-03 MED FILL — SYNTHROID 112 MCG TABLET: 112 | 30 days supply | Qty: 30 | Fill #2

## 2016-04-25 NOTE — Progress Notes (Deleted)
46 y.o. [id number] Divorced  {Race/ethnicity:17218} Fe here for annual exam.    Patient's last menstrual period was 04/09/2015.          Sexually active: {yes no:314532}  The current method of family planning is {contraception:315051}.    Exercising: {yes no:314532}  {types:19826} Smoker:  {YES NO:22349}  Health Maintenance: Pap:  06-19-11 ASCUS HPV HR neg, [id number] neg MMG:  04-27-15 category b density birads 1:neg Colonoscopy:  2012 BMD:   none TDaP:  2015 Shingles: no Pneumonia: no Hep C and HIV: *** Labs:  Self breast exam:   reports that she has never smoked. She has never used smokeless tobacco. She reports that she drinks alcohol. She reports that she does not use drugs.  Past Medical History:  Diagnosis Date  . Abnormal Pap smear    ASCUS-H +HPV 11/11, 8/12  ascus, 5/13 ASCUS HPV-  . AMA (advanced maternal age) multigravida 14+   . Colon polyps   . Complication of anesthesia 12 yrs ago   woke up in middle of anesthesia for eye surgery  . Depression    pp depression x2  - no problems at present  . Depression, postpartum   . FHx: colon cancer   . Gallstones    3 or 4 gallbladder attacks - with abdominal pain, nausea and vomiting and reflux  . GERD (gastroesophageal reflux disease)   . H/O varicella   . Hypothyroidism   . MSH6-related Lynch syndrome [facility])    "genetic tendency to form cancer"-none at present.  . Obese   . Vaginal delivery 2003, 2008, 2014    Past Surgical History:  Procedure Laterality Date  . CHOLECYSTECTOMY N/A 04/18/2013   Procedure: LAPAROSCOPIC CHOLECYSTECTOMY WITH INTRAOPERATIVE CHOLANGIOGRAM;  Surgeon: Odis Hollingshead, MD;  Location: [facility];  Service: General;  Laterality: N/A;  . COLONOSCOPY WITH PROPOFOL N/A 05/02/2015   Procedure: COLONOSCOPY WITH PROPOFOL;  Surgeon: Arta Silence, MD;  Location: [facility];  Service: Endoscopy;  Laterality: N/A;  . COLPOSCOPY     2/12,8/12 HPV  changes only  . CYSTO N/A 05/21/2015   Procedure: CYSTO;   Surgeon: Megan Salon, MD;  Location: [place] [facility];  Service: Gynecology;  Laterality: N/A;  . ESOPHAGOGASTRODUODENOSCOPY (EGD) WITH PROPOFOL N/A 05/02/2015   Procedure: ESOPHAGOGASTRODUODENOSCOPY (EGD) WITH PROPOFOL;  Surgeon: Arta Silence, MD;  [facility];  Service: Endoscopy;  Laterality: N/A;  . EYE SURGERY     L eye  . LAPAROSCOPIC BILATERAL SALPINGO OOPHERECTOMY Bilateral 05/21/2015   Procedure: LAPAROSCOPIC BILATERAL SALPINGO OOPHORECTOMY;  Surgeon: Megan Salon, MD;  Location: [place] [facility];  Service: Gynecology;  Laterality: Bilateral;  . LAPAROSCOPIC HYSTERECTOMY N/A 05/21/2015   Procedure: HYSTERECTOMY TOTAL LAPAROSCOPIC;  Surgeon: Megan Salon, MD;  Location: [place] [facility];  Service: Gynecology;  Laterality: N/A;  . LAPAROSCOPY     for ovarian cyst  . MOUTH SURGERY    . TONSILLECTOMY      Current Outpatient Prescriptions  Medication Sig Dispense Refill  . Multiple Vitamin (MULTIVITAMIN) tablet Take 1 tablet by mouth daily.    Marland Kitchen SYNTHROID 88 MCG tablet Take 88 mcg by mouth daily.  1   No current facility-administered medications for this visit.     Family History  Problem Relation Age of Onset  . Arthritis Mother   . COPD Mother   . Cancer Mother     colon and lung  . Depression Mother   . Diabetes Mother   . Heart disease Mother   . Hyperlipidemia Mother   .  Hypertension Mother   . Hypertension Father   . Depression Father   . Depression Sister   . Kidney disease Sister   . Hypothyroidism Sister   . Other Sister     enlarged heart  . Fibromyalgia Sister   . Cancer Maternal Aunt     NOS  . Melanoma Paternal Uncle   . SIDS Brother     ROS:  Pertinent items are noted in HPI.  Otherwise, a comprehensive ROS was negative.  Exam:   LMP 04/09/2015 Comment: Implanted Nuvaring vaginally   Ht Readings from Last 3 Encounters:  06/25/15 '5\' 8"'$  (1.727 m)  05/28/15 '5\' 8"'$  (1.727 m)  05/21/15 '5\' 8"'$  (1.727 m)    General appearance: alert, cooperative and appears stated  age Head: Normocephalic, without obvious abnormality, atraumatic Neck: no adenopathy, supple, symmetrical, trachea midline and thyroid {EXAM; THYROID:18604} Lungs: clear to auscultation bilaterally Breasts: {Exam; breast:13139::"normal appearance, no masses or tenderness"} Heart: regular rate and rhythm Abdomen: soft, non-tender; no masses,  no organomegaly Extremities: extremities normal, atraumatic, no cyanosis or edema Skin: Skin color, texture, turgor normal. No rashes or lesions Lymph nodes: Cervical, supraclavicular, and axillary nodes normal. No abnormal inguinal nodes palpated Neurologic: Grossly normal   Pelvic: External genitalia:  no lesions              Urethra:  normal appearing urethra with no masses, tenderness or lesions              Bartholin's and Skene's: normal                 Vagina: normal appearing vagina with normal color and discharge, no lesions              Cervix: {exam; cervix:14595}              Pap taken: {yes no:314532} Bimanual Exam:  Uterus:  {exam; uterus:12215}              Adnexa: {exam; adnexa:12223}               Rectovaginal: Confirms               Anus:  normal sphincter tone, no lesions  Chaperone present: ***  A:  Well Woman with normal exam  P:   Reviewed health and wellness pertinent to exam  Pap smear as above  {plan; gyn:5269::"mammogram","pap smear","return annually or prn"}  An After Visit Summary was printed and given to the patient.

## 2016-04-28 ENCOUNTER — Ambulatory Visit: Payer: 59 | Admitting: Obstetrics & Gynecology

## 2016-05-02 MED FILL — SYNTHROID 112 MCG TABLET: 112 | 30 days supply | Qty: 30 | Fill #0

## 2016-05-21 ENCOUNTER — Encounter (HOSPITAL_COMMUNITY): Payer: Self-pay

## 2016-06-03 MED FILL — AMOXICILLIN 875 MG TABLET: 875 | 10 days supply | Qty: 20 | Fill #0

## 2016-06-04 MED FILL — SYNTHROID 112 MCG TABLET: 112 | 30 days supply | Qty: 30 | Fill #1

## 2016-07-02 MED FILL — SYNTHROID 112 MCG TABLET: 112 | 30 days supply | Qty: 30 | Fill #0

## 2016-07-28 MED FILL — CLINDAMYCIN HCL 150 MG CAPS: 150 | 7 days supply | Qty: 28 | Fill #0

## 2016-08-01 MED FILL — SYNTHROID 112 MCG TABLET: 112 | 30 days supply | Qty: 30 | Fill #1

## 2016-09-12 ENCOUNTER — Telehealth: Payer: Self-pay | Admitting: Obstetrics & Gynecology

## 2016-09-12 MED FILL — SYNTHROID 112 MCG TABLET: 112 | 30 days supply | Qty: 30 | Fill #2

## 2016-09-12 NOTE — Telephone Encounter (Signed)
Patient called with questions about "possible vaginal atrophy."

## 2016-09-12 NOTE — Telephone Encounter (Signed)
Spoke with patient. Reports vaginal itching started 4-5 days ago. Has treated with Monistat 3 day and used coconut oil with no relief. Denies vaginal discharge, odor, or bleeding. Changed soap recently to [employer] feminine wash with no changes.   Last OV 08/14/15, recommended OV for further evaluation. Patient scheduled for 09/16/16 at 3pm with Dr. Sabra Heck. Advised patient would review with Dr. Sabra Heck and return call with any additional recommendations. Patient verbalizes understanding and is agreeable.  Routing to provider for final review. Patient is agreeable to disposition. Will close encounter.

## 2016-09-16 ENCOUNTER — Ambulatory Visit: Payer: Self-pay | Admitting: Obstetrics & Gynecology

## 2016-09-17 ENCOUNTER — Other Ambulatory Visit (HOSPITAL_COMMUNITY)
Admission: RE | Admit: 2016-09-17 | Discharge: 2016-09-17 | Disposition: A | Discharge status: Home / Self Care | Payer: 59 | Source: Ambulatory Visit | Attending: Obstetrics & Gynecology | Admitting: Obstetrics & Gynecology

## 2016-09-17 ENCOUNTER — Encounter: Payer: Self-pay | Admitting: Certified Nurse Midwife

## 2016-09-17 ENCOUNTER — Ambulatory Visit (INDEPENDENT_AMBULATORY_CARE_PROVIDER_SITE_OTHER): Payer: 59 | Admitting: Certified Nurse Midwife

## 2016-09-17 VITALS — BP 114/80 | HR 72 | Resp 16 | Ht 68.25 in | Wt 283.0 lb

## 2016-09-17 DIAGNOSIS — B372 Candidiasis of skin and nail: Secondary | ICD-10-CM

## 2016-09-17 DIAGNOSIS — L298 Other pruritus: Secondary | ICD-10-CM | POA: Diagnosis not present

## 2016-09-17 DIAGNOSIS — N898 Other specified noninflammatory disorders of vagina: Secondary | ICD-10-CM

## 2016-09-17 MED ORDER — NYSTATIN-TRIAMCINOLONE 100000-0.1 UNIT/GM-% EX OINT: 1.0000 "application " | TOPICAL_OINTMENT | Freq: Two times a day (BID) | CUTANEOUS | 0 refills | Status: DC

## 2016-09-17 MED FILL — NYSTATIN-TRIAMCINOLONE OINT: 100000-0.1 | 7 days supply | Qty: 30 | Fill #0

## 2016-09-17 NOTE — Progress Notes (Signed)
46 y.o. Divorced Caucasian female G46P30L3 here with complaint of vaginal symptoms of itching, burning, no increase in discharge. Onset of symptoms 9 days ago. Denies new personal products or ? vaginal dryness. Has gone without underwear and used OTC Monistat derm with no change, even used alcohol to area. Has also tried perineal  Wash with no change. Recently shaved all the area to see if this would resolve itching. No pain with intercourse. No STD concerns. Urinary symptoms none . Contraception is hysterectomy.  ROS Pertinent HPI.  O:Healthy female WDWN Affect: normal, orientation x 3  Exam: Skin warm and dry Abdomen: Soft,  Non tender.  inguinal Lymph nodes: no enlargement or tenderness Pelvic exam: External genital: normal female, slight increase pink in vulva area bilateral, increase redness inside vulva and labia noted with scant exudate, wet prep taken external area BUS: negative Vagina: white non odorous discharge noted. Ph:4.0  Vaginal screen taken  Cervix and uterus absent Adnexa:absent, no masses or fullness noted   Wet Prep results:external KOH,Saline + yeast   A:Normal pelvic exam Yeast dermatitis R/O vaginal infection   P:Discussed findings of yeast dermatitis and etiology. Discussed Aveeno or sitz bath for comfort. Avoid moist clothes or pads for extended period of time. Coconut Oil use for skin protection prior to activity can be used to external skin for protection or dryness. Rx: Mycolog ointment see order with instructions. Patient will use as directed and will advise if no change. Lab; Vaginal screen  Rv prn

## 2016-09-19 LAB — CERVICOVAGINAL ANCILLARY ONLY
Bacterial vaginitis: NEGATIVE
Candida vaginitis: NEGATIVE
TRICH (WINDOWPATH): NEGATIVE

## 2016-09-22 ENCOUNTER — Telehealth: Payer: Self-pay

## 2016-09-22 NOTE — Telephone Encounter (Signed)
-----   Message from Regina Eck, CNM sent at 09/19/2016  7:52 PM EDT ----- Notify patient that vaginal screen was negative for BV, yeast and Trichomonas Yeast dermatitis should be better with Mycolog ointment use. Patient status?

## 2016-09-22 NOTE — Telephone Encounter (Signed)
The following results were reviewed with patient in detail. Patient states that the yeast dermatitis has gotten much better with the Mycolog ointment. No further complaints.

## 2016-10-10 DIAGNOSIS — H5211 Myopia, right eye: Secondary | ICD-10-CM | POA: Diagnosis not present

## 2016-10-22 DIAGNOSIS — R030 Elevated blood-pressure reading, without diagnosis of hypertension: Secondary | ICD-10-CM | POA: Diagnosis not present

## 2016-10-22 DIAGNOSIS — E039 Hypothyroidism, unspecified: Secondary | ICD-10-CM | POA: Diagnosis not present

## 2016-10-22 DIAGNOSIS — Z131 Encounter for screening for diabetes mellitus: Secondary | ICD-10-CM | POA: Diagnosis not present

## 2016-10-22 DIAGNOSIS — Z23 Encounter for immunization: Secondary | ICD-10-CM | POA: Diagnosis not present

## 2016-10-22 MED FILL — SYNTHROID 112 MCG TABLET: 112 | 30 days supply | Qty: 30 | Fill #0

## 2016-11-17 MED FILL — SYNTHROID 112 MCG TABLET: 112 | 30 days supply | Qty: 30 | Fill #1

## 2016-11-19 NOTE — Progress Notes (Signed)
46 y.o. G13P3013 Divorced Caucasian F here for annual exam.  Having a tough year as sister died this year and brother diagnosed with colon cancer.  He is 41.  No one else has done lynch testing.  She is very frustrated with family members.    Denies vaginal bleeding.  Hot flashes are manageable.  Husband thinks she is "moody".    Frustrated with weight.  Has considered weight loss surgery  PCP:  Dr. Orland Mustard.  Being followed for hypertension and elevated HbA1C.  D/w pt Dr. Leafy Ro.  She would like a referral.  Patient's last menstrual period was 04/09/2015.          Sexually active: Yes.    The current method of family planning is status post hysterectomy.   Exercising: Yes.    walking Smoker:  no  Health Maintenance: Pap:  04/05/15 negative, 04/13/14 negative, HR HPV negative  History of abnormal Pap:  yes MMG:  04/27/15 BIRADS 1 negative  Colonoscopy:  05/02/15 polyp- repeat 1-2 years for personal history of Lynch syndrome BMD:   never TDaP:  02/17/13  Pneumonia vaccine(s):  never Zostavax:   never Hep C testing: not indicated  Screening Labs: PCP, Hb today: PCP   reports that she has never smoked. She has never used smokeless tobacco. She reports that she does not drink alcohol or use drugs.  Past Medical History:  Diagnosis Date  . Abnormal Pap smear    ASCUS-H +HPV 11/11, 8/12  ascus, 5/13 ASCUS HPV-  . AMA (advanced maternal age) multigravida 24+   . Colon polyps   . Complication of anesthesia 12 yrs ago   woke up in middle of anesthesia for eye surgery  . Depression    pp depression x2  - no problems at present  . Depression, postpartum   . FHx: colon cancer   . Gallstones    3 or 4 gallbladder attacks - with abdominal pain, nausea and vomiting and reflux  . GERD (gastroesophageal reflux disease)   . H/O varicella   . Hypothyroidism   . MSH6-related Lynch syndrome [facility])    "genetic tendency to form cancer"-none at present.  . Obese   . Vaginal delivery 2003, 2008, 2014     Past Surgical History:  Procedure Laterality Date  . CHOLECYSTECTOMY N/A 04/18/2013   Procedure: LAPAROSCOPIC CHOLECYSTECTOMY WITH INTRAOPERATIVE CHOLANGIOGRAM;  Surgeon: Odis Hollingshead, MD;  [facility];  Service: General;  Laterality: N/A;  . COLONOSCOPY WITH PROPOFOL N/A 05/02/2015   Procedure: COLONOSCOPY WITH PROPOFOL;  Surgeon: Arta Silence, MD;  [facility];  Service: Endoscopy;  Laterality: N/A;  . COLPOSCOPY     2/12,8/12 HPV  changes only  . CYSTO N/A 05/21/2015   Procedure: CYSTO;  Surgeon: Megan Salon, MD;  Location: [place] [facility];  Service: Gynecology;  Laterality: N/A;  . ESOPHAGOGASTRODUODENOSCOPY (EGD) WITH PROPOFOL N/A 05/02/2015   Procedure: ESOPHAGOGASTRODUODENOSCOPY (EGD) WITH PROPOFOL;  Surgeon: Arta Silence, MD;  [facility];  Service: Endoscopy;  Laterality: N/A;  . EYE SURGERY     L eye  . LAPAROSCOPIC BILATERAL SALPINGO OOPHERECTOMY Bilateral 05/21/2015   Procedure: LAPAROSCOPIC BILATERAL SALPINGO OOPHORECTOMY;  Surgeon: Megan Salon, MD;  Location: [facility] [facility];  Service: Gynecology;  Laterality: Bilateral;  . LAPAROSCOPIC HYSTERECTOMY N/A 05/21/2015   Procedure: HYSTERECTOMY TOTAL LAPAROSCOPIC;  Surgeon: Megan Salon, MD;  Location: [place] [facility];  Service: Gynecology;  Laterality: N/A;  . LAPAROSCOPY     for ovarian cyst  . MOUTH SURGERY    .  TONSILLECTOMY      Current Outpatient Prescriptions  Medication Sig Dispense Refill  . Multiple Vitamin (MULTIVITAMIN) tablet Take 1 tablet by mouth as needed.     . nystatin-triamcinolone ointment (MYCOLOG) Apply 1 application topically 2 (two) times daily. For 7 days 30 g 0  . SYNTHROID 112 MCG tablet   2   No current facility-administered medications for this visit.     Family History  Problem Relation Age of Onset  . Arthritis Mother   . COPD Mother   . Cancer Mother        colon and lung  . Depression Mother   . Diabetes Mother   . Heart disease Mother   . Hyperlipidemia Mother   .  Hypertension Mother   . Hypertension Father   . Depression Father   . Depression Sister   . Kidney disease Sister   . Hypothyroidism Sister   . Other Sister        enlarged heart  . Fibromyalgia Sister   . Heart attack Sister   . Colon cancer Brother   . Cancer Maternal Aunt        NOS  . Melanoma Paternal Uncle   . SIDS Brother   . Lung cancer Maternal Aunt     ROS:  Pertinent items are noted in HPI.  Otherwise, a comprehensive ROS was negative.  Exam:   BP (!) 144/86 (BP Location: Right Arm, Patient Position: Sitting, Cuff Size: Large)   Pulse 72   Resp 16   Ht 5' 7.5" (1.715 m)   Wt 280 lb (127 kg)   LMP 04/09/2015   BMI 43.21 kg/m   Weight change: +15#  Height: 5' 7.5" (171.5 cm)  Ht Readings from Last 3 Encounters:  11/20/16 5' 7.5" (1.715 m)  09/17/16 5' 8.25" (1.734 m)  06/25/15 _0  (1.727 m)    General appearance: alert, cooperative and appears stated age Head: Normocephalic, without obvious abnormality, atraumatic Neck: no adenopathy, supple, symmetrical, trachea midline and thyroid normal to inspection and palpation Lungs: clear to auscultation bilaterally Breasts: normal appearance, no masses or tenderness Heart: regular rate and rhythm Abdomen: soft, non-tender; bowel sounds normal; no masses,  no organomegaly Extremities: extremities normal, atraumatic, no cyanosis or edema Skin: Skin color, texture, turgor normal. No rashes or lesions Lymph nodes: Cervical, supraclavicular, and axillary nodes normal. No abnormal inguinal nodes palpated Neurologic: Grossly normal   Pelvic: External genitalia:  no lesions              Urethra:  normal appearing urethra with no masses, tenderness or lesions              Bartholins and Skenes: normal                 Vagina: normal appearing vagina with normal color and discharge, no lesions              Cervix: absent              Pap taken: No. Bimanual Exam:  Uterus:  uterus absent              Adnexa: normal  adnexa and no mass, fullness, tenderness               Rectovaginal: Confirms               Anus:  normal sphincter tone, no lesions  Chaperone was present for exam.  A:  Well Woman with normal exam Lynch  syndromes with MSH6 gene mutation, s/p TLH/BSO/cystoscopy 05/21/15 H/O ASCUS with +HR HPV but pap and HPV prior to hysterectomy was negative and pathology was negative Morbid obesity Hypothyroidism  P:   Mammogram guidelines reviewed.  3D discussed. Referral to Dr. Leafy Ro for weight loss consultation Urine micro and cytology obtained today pap smear not needed today Lab work and vaccines UTD with Dr. Orland Mustard. Return annually or prn

## 2016-11-20 ENCOUNTER — Ambulatory Visit (INDEPENDENT_AMBULATORY_CARE_PROVIDER_SITE_OTHER): Payer: 59 | Admitting: Obstetrics & Gynecology

## 2016-11-20 ENCOUNTER — Encounter: Payer: Self-pay | Admitting: Obstetrics & Gynecology

## 2016-11-20 ENCOUNTER — Other Ambulatory Visit (HOSPITAL_COMMUNITY)
Admission: RE | Admit: 2016-11-20 | Discharge: 2016-11-20 | Disposition: A | Discharge status: Home / Self Care | Payer: 59 | Source: Ambulatory Visit | Attending: Radiology | Admitting: Radiology

## 2016-11-20 VITALS — BP 144/86 | HR 72 | Resp 16 | Ht 67.5 in | Wt 280.0 lb

## 2016-11-20 DIAGNOSIS — Z1509 Genetic susceptibility to other malignant neoplasm: Secondary | ICD-10-CM | POA: Diagnosis not present

## 2016-11-20 DIAGNOSIS — Z01419 Encounter for gynecological examination (general) (routine) without abnormal findings: Secondary | ICD-10-CM | POA: Diagnosis not present

## 2016-11-20 DIAGNOSIS — Z0000 Encounter for general adult medical examination without abnormal findings: Secondary | ICD-10-CM

## 2016-11-20 LAB — POCT URINALYSIS DIPSTICK
BILIRUBIN UA: NEGATIVE
GLUCOSE UA: NEGATIVE
Ketones, UA: NEGATIVE
LEUKOCYTES UA: NEGATIVE
NITRITE UA: NEGATIVE
Urobilinogen, UA: 0.2 E.U./dL
pH, UA: 7 (ref 5.0–8.0)

## 2016-11-20 NOTE — Progress Notes (Signed)
Spoke with Tamika at [facility]. States to send urine for cytology in sterile cup and notate on order requisition that urine was a voided, clean catch sample.

## 2016-11-21 LAB — URINALYSIS, MICROSCOPIC ONLY: CASTS: NONE SEEN /LPF

## 2016-12-08 ENCOUNTER — Encounter: Payer: 59 | Attending: Family Medicine | Admitting: Registered"

## 2016-12-08 ENCOUNTER — Encounter: Payer: Self-pay | Admitting: Registered"

## 2016-12-08 DIAGNOSIS — R7303 Prediabetes: Secondary | ICD-10-CM | POA: Insufficient documentation

## 2016-12-08 NOTE — Patient Instructions (Signed)
For overall health, getting in veggies whenever possible. Humus & veggies is an idea for snacks Sleep quality and quantity important for health and can affect our food choices. Aim to balanced meals and snacks. 3-4 carb choices or 45-60 g carbs for meals 0-1 carb choices for snacks if desired

## 2016-12-08 NOTE — Progress Notes (Signed)
Medical Nutrition Therapy:  Appt start time: 1120 end time:  1220.  Assessment:  Primary concerns today: Pt states her A1c is up over 6.1%, her BP and weight are also up. Pt states a recent FBG was 108 mg/dL, she will spot check her BG.  Pt states she was down 30 lbs by watching what she ate and doing really well with getting in regular exercise, 15 min am, 30-45 min evening for 5 months, but after hysterctomy stopped exercising. Pt states she started exercising again yesterday, but her knees hurt from 45 min on treadmill.  Sleep: horrible, wakes up during night and can't get back to sleep. Usually gets 6 hrs or less.  Preferred Learning Style:   No preference indicated   Learning Readiness:   Ready  MEDICATIONS: reviewed   DIETARY INTAKE:  Usual eating pattern includes 3 meals and 1-2 snacks per day.  24-hr recall:  B ( AM): eggs, sausage, hashbrowns  Snk ( AM): none OR bar  L ( PM): cafateria Snk ( PM): none D (8--10:30 PM): chicken, green beans, brown rice, funnel cake OR fish sticks & fries OR if working late PB & jelly sandwich Snk ( PM): none Beverages: water, coffee  Usual physical activity: ADL, 10K steps.  Estimated energy needs: 1800 calories 200 g carbohydrates 113 g protein   60 g fat  Progress Towards Goal(s):  In progress.   Nutritional Diagnosis:  NI-5.8.5 Inadeqate fiber intake As related to small amount of whole grains, vegetables, and legumes.  As evidenced by dietary recall and pt states food preference.    Intervention: Nutrition education for managing blood glucose with diet and lifestyle changes. Described diabetes. Defined the role of glucose and insulin.  Described the role of different macronutrients on glucose.  Explained how carbohydrates affect blood glucose.  Stated what foods contain the most carbohydrates.  Demonstrated carbohydrate counting.  Demonstrated how to read Nutrition Facts food label. Described role of fiber and vegetables in  health.  Plan: For overall health, getting in veggies whenever possible. Humus & veggies is an idea for snacks Sleep quality and quantity important for health and can affect our food choices. Aim to balanced meals and snacks. 3-4 carb choices or 45-60 g carbs for meals 0-1 carb choices for snacks if desired  Teaching Method Utilized:  Visual Auditory Hands on  Handouts given during visit include:  A1c chart  My Plate  Sleep Hygiene  Barriers to learning/adherence to lifestyle change: none  Demonstrated degree of understanding via:  Teach Back   Monitoring/Evaluation:  Dietary intake, exercise, and body weight prn.

## 2016-12-09 DIAGNOSIS — R7303 Prediabetes: Secondary | ICD-10-CM | POA: Insufficient documentation

## 2016-12-28 MED FILL — SYNTHROID 112 MCG TABLET: 112 | 30 days supply | Qty: 30 | Fill #2

## 2017-01-26 MED FILL — SYNTHROID 112 MCG TABLET: 112 | 30 days supply | Qty: 30 | Fill #3

## 2017-02-27 MED FILL — SYNTHROID 112 MCG TABLET: 112 | 30 days supply | Qty: 30 | Fill #4

## 2017-03-19 ENCOUNTER — Encounter (INDEPENDENT_AMBULATORY_CARE_PROVIDER_SITE_OTHER): Payer: 59

## 2017-03-25 ENCOUNTER — Ambulatory Visit (INDEPENDENT_AMBULATORY_CARE_PROVIDER_SITE_OTHER): Payer: 59 | Admitting: Family Medicine

## 2017-03-25 ENCOUNTER — Encounter (INDEPENDENT_AMBULATORY_CARE_PROVIDER_SITE_OTHER): Payer: Self-pay | Admitting: Family Medicine

## 2017-03-25 VITALS — BP 132/84 | HR 73 | Temp 98.3°F | Ht 68.0 in | Wt 279.0 lb

## 2017-03-25 DIAGNOSIS — E038 Other specified hypothyroidism: Secondary | ICD-10-CM

## 2017-03-25 DIAGNOSIS — Z9189 Other specified personal risk factors, not elsewhere classified: Secondary | ICD-10-CM

## 2017-03-25 DIAGNOSIS — R0602 Shortness of breath: Secondary | ICD-10-CM | POA: Diagnosis not present

## 2017-03-25 DIAGNOSIS — R7303 Prediabetes: Secondary | ICD-10-CM | POA: Diagnosis not present

## 2017-03-25 DIAGNOSIS — Z6841 Body Mass Index (BMI) 40.0 and over, adult: Secondary | ICD-10-CM

## 2017-03-25 DIAGNOSIS — Z0289 Encounter for other administrative examinations: Secondary | ICD-10-CM

## 2017-03-25 DIAGNOSIS — E66813 Obesity, class 3: Secondary | ICD-10-CM

## 2017-03-25 DIAGNOSIS — Z1331 Encounter for screening for depression: Secondary | ICD-10-CM

## 2017-03-25 DIAGNOSIS — R5383 Other fatigue: Secondary | ICD-10-CM | POA: Diagnosis not present

## 2017-03-25 NOTE — Progress Notes (Signed)
Office: [phone]  /  Fax: [phone]   Dear Dr. Sabra Graves,   Thank you for referring Grace Graves to our clinic. The following note includes my evaluation and treatment recommendations.  HPI:   Chief Complaint: OBESITY    Grace Graves has been referred by Grace Salon, MD for consultation regarding her obesity and obesity related comorbidities.    Grace Graves (MR# [id number]) is a 47 y.o. female who presents on 03/25/2017 for obesity evaluation and treatment. Current BMI is Body mass index is 42.42 kg/m.Marland Kitchen Grace Graves has been struggling with her weight for many years and has been unsuccessful in either losing weight, maintaining weight loss, or reaching her healthy weight goal.     Grace Graves attended our information session and states she is currently in the action stage of change and ready to dedicate time achieving and maintaining a healthier weight. Grace Graves is interested in becoming our patient and working on intensive lifestyle modifications including (but not limited to) diet, exercise and weight loss.    Grace Graves states her family eats meals together she thinks her family will eat healthier with  her her desired weight loss is 109 lbs she has been heavy most of  her life she started gaining weight in mid 20's her heaviest weight ever was 279 lbs. she has significant food cravings issues  she snacks frequently in the evenings she is frequently drinking liquids with calories she frequently eats larger portions than normal  she struggles with emotional eating    Fatigue Saman feels her energy is lower than it should be. This has worsened with weight gain and has not worsened recently. [name] admits to daytime somnolence and  admits to waking up still tired. Patient is at risk for obstructive sleep apnea. Patent has a history of symptoms of daytime fatigue and morning headache. Patient generally gets 6 hours of sleep per night, and states they generally  have nightime awakenings. Snoring is present. Apneic episodes are not present. Epworth Sleepiness Score is 13.  Dyspnea on exertion Grace Graves notes increasing shortness of breath with exercising and seems to be worsening over time with weight gain. She notes getting out of breath sooner with activity than she used to. She has difficulty walking up stairs secondary to feeling more shortness of breath secondary to weight. This has not gotten worse recently. EKG within normal limits. [name] denies orthopnea.  Pre-Diabetes Grace Graves has a diagnosis of pre-diabetes based on her elevated Hgb A1c and was informed this puts her at greater risk of developing diabetes. Her Hgb A1c in 2016 of 6.1 but no recent other values in My Chart. She is not taking metformin currently and continues to work on diet and exercise to decrease risk of diabetes. She denies polydipsia, polyuria, or hypoglycemia events.  At risk for diabetes Grace Graves is at higher than average risk for developing diabetes due to her obesity and pre-diabetes. She currently denies polyuria or polydipsia.  Hypothyroidism Grace Graves has a diagnosis of hypothyroidism. She is on Synthroid. Grace Graves reports having her thyroid checked recently but doesn't recall the results. She denies hot or cold intolerance or palpitations, but does admit to ongoing fatigue.  Depression Screen Grace Graves's Food and Mood (modified PHQ-9) score was  Depression screen PHQ 2/9 03/25/2017  Decreased Interest 2  Down, Depressed, Hopeless 0  PHQ - 2 Score 2  Altered sleeping 2  Tired, decreased energy 3  Change in appetite 3  Feeling bad or failure about yourself  0  Trouble concentrating  0  Moving slowly or fidgety/restless 0  Suicidal thoughts 0  PHQ-9 Score 10  Difficult doing work/chores Not difficult at all    ALLERGIES: No Known Allergies  MEDICATIONS: Current Outpatient Medications on File Prior to Visit  Medication Sig Dispense Refill  . SYNTHROID  112 MCG tablet   2   No current facility-administered medications on file prior to visit.     PAST MEDICAL HISTORY: Past Medical History:  Diagnosis Date  . Abnormal Pap smear    ASCUS-H +HPV 11/11, 8/12  [place], 5/13 ASCUS HPV-  . AMA (advanced maternal age) multigravida 5+   . Colon polyps   . Complication of anesthesia 12 yrs ago   woke up in middle of anesthesia for eye surgery  . Depression    pp depression x2  - no problems at present  . Depression, postpartum   . FHx: colon cancer   . Gallstones    3 or 4 gallbladder attacks - with abdominal pain, nausea and vomiting and reflux  . GERD (gastroesophageal reflux disease)   . H/O varicella   . Hypothyroidism   . 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  . Obese   . Vaginal delivery 2003, 2008, 2014    PAST SURGICAL HISTORY: Past Surgical History:  Procedure Laterality Date  . CHOLECYSTECTOMY N/A 04/18/2013   Procedure: LAPAROSCOPIC CHOLECYSTECTOMY WITH INTRAOPERATIVE CHOLANGIOGRAM;  Surgeon: Odis Hollingshead, MD;  Location: [facility];  Service: General;  Laterality: N/A;  . COLONOSCOPY WITH PROPOFOL N/A 05/02/2015   Procedure: COLONOSCOPY WITH PROPOFOL;  Surgeon: Arta Silence, MD;  Location: [facility];  Service: Endoscopy;  Laterality: N/A;  . COLPOSCOPY     2/12,8/12 HPV  changes only  . CYSTO N/A 05/21/2015   Procedure: CYSTO;  Surgeon: Grace Salon, MD;  Location: [facility];  Service: Gynecology;  Laterality: N/A;  . ESOPHAGOGASTRODUODENOSCOPY (EGD) WITH PROPOFOL N/A 05/02/2015   Procedure: ESOPHAGOGASTRODUODENOSCOPY (EGD) WITH PROPOFOL;  Surgeon: Arta Silence, MD;  Location: [facility];  Service: Endoscopy;  Laterality: N/A;  . EYE SURGERY     L eye  . LAPAROSCOPIC BILATERAL SALPINGO OOPHERECTOMY Bilateral 05/21/2015   Procedure: LAPAROSCOPIC BILATERAL SALPINGO OOPHORECTOMY;  Surgeon: Grace Salon, MD;  Location: [facility];  Service: Gynecology;  Laterality: Bilateral;    . LAPAROSCOPIC HYSTERECTOMY N/A 05/21/2015   Procedure: HYSTERECTOMY TOTAL LAPAROSCOPIC;  Surgeon: Grace Salon, MD;  Location: [place] [facility];  Service: Gynecology;  Laterality: N/A;  . LAPAROSCOPY     for ovarian cyst  . MOUTH SURGERY    . TONSILLECTOMY      SOCIAL HISTORY: Social History   Tobacco Use  . Smoking status: Never Smoker  . Smokeless tobacco: Never Used  Substance Use Topics  . Alcohol use: No    Alcohol/week: 0.0 oz  . Drug use: No    FAMILY HISTORY: Family History  Problem Relation Age of Onset  . Arthritis Mother   . COPD Mother   . Cancer Mother        colon and lung  . Depression Mother   . Diabetes Mother   . Heart disease Mother   . Hyperlipidemia Mother   . Hypertension Mother   . Hypertension Father   . Depression Father   . Depression Sister   . Kidney disease Sister   . Hypothyroidism Sister   . Other Sister        enlarged heart  . Fibromyalgia Sister   . Heart  attack Sister   . Colon cancer Brother   . Cancer Maternal Aunt        NOS  . Melanoma Paternal Uncle   . SIDS Brother   . Lung cancer Maternal Aunt     ROS: Review of Systems  Constitutional: Positive for malaise/fatigue. Negative for weight loss.       Negative hot/cold intolerance  Respiratory: Positive for shortness of breath (with exertion).   Cardiovascular: Negative for palpitations and orthopnea.  Genitourinary: Negative for frequency.  Endo/Heme/Allergies: Negative for polydipsia.       Negative hypoglycemia  Psychiatric/Behavioral: Positive for depression. Negative for suicidal ideas.    PHYSICAL EXAM: Blood pressure 132/84, pulse 73, temperature 98.3 F (36.8 C), temperature source Oral, height '5\' 8"'$  (1.727 m), weight 279 lb (126.6 kg), last menstrual period 04/09/2015. Body mass index is 42.42 kg/m. Physical Exam  Constitutional: She is oriented to person, place, and time. She appears well-developed and well-nourished.  HENT:  Head: Normocephalic and  atraumatic.  Nose: Nose normal.  Eyes: EOM are normal. No scleral icterus.  + iris irregularity of Left eye  Neck: Normal range of motion. Neck supple. No thyromegaly present.  Cardiovascular: Normal rate and regular rhythm.  Pulmonary/Chest: Effort normal. No respiratory distress.  Abdominal: Soft. There is no tenderness.  + Obesity  Musculoskeletal:  Range of Motion normal in all 4 extremities Trace edema noted in bilateral lower extremities  Neurological: She is alert and oriented to person, place, and time. Coordination normal.  Skin: Skin is warm and dry.  + hirsutism  Psychiatric: She has a normal mood and affect. Her behavior is normal.  Vitals reviewed.   RECENT LABS AND TESTS: BMET    Component Value Date/Time   NA 138 04/12/2013 1430   K 4.1 04/12/2013 1430   CL 100 04/12/2013 1430   CO2 25 04/12/2013 1430   GLUCOSE 92 04/12/2013 1430   BUN 13 04/12/2013 1430   CREATININE 0.71 04/12/2013 1430   CALCIUM 9.4 04/12/2013 1430   GFRNONAA >90 04/12/2013 1430   GFRAA >90 04/12/2013 1430   Lab Results  Component Value Date   HGBA1C 6.1 (H) 04/13/2014   No results found for: INSULIN CBC    Component Value Date/Time   WBC 9.3 05/22/2015 0548   RBC 3.95 05/22/2015 0548   HGB 10.7 (L) 05/22/2015 0548   HCT 32.8 (L) 05/22/2015 0548   PLT 293 05/22/2015 0548   MCV 83.0 05/22/2015 0548   MCH 27.1 05/22/2015 0548   MCHC 32.6 05/22/2015 0548   RDW 15.2 05/22/2015 0548   Iron/TIBC/Ferritin/ %Sat No results found for: IRON, TIBC, FERRITIN, IRONPCTSAT Lipid Panel     Component Value Date/Time   CHOL 173 04/13/2014 1053   TRIG 134 04/13/2014 1053   HDL 37 (L) 04/13/2014 1053   CHOLHDL 4.7 04/13/2014 1053   VLDL 27 04/13/2014 1053   LDLCALC 109 (H) 04/13/2014 1053   Hepatic Function Panel     Component Value Date/Time   PROT 7.7 04/12/2013 1430   ALBUMIN 3.8 04/12/2013 1430   AST 16 04/12/2013 1430   ALT 13 04/12/2013 1430   ALKPHOS 85 04/12/2013 1430    BILITOT 0.3 04/12/2013 1430      Component Value Date/Time   TSH 6.890 (H) 04/13/2014 1053    ECG  shows NSR with a rate of 81 BPM INDIRECT CALORIMETER done today shows a VO2 of 297 and a REE of 2069.  Her calculated basal metabolic rate is 2952 thus  her basal metabolic rate is better than expected.    ASSESSMENT AND PLAN: Other fatigue - Plan: EKG 12-Lead, Vitamin B12, CBC With Differential, Folate, Lipid Panel With LDL/HDL Ratio, VITAMIN D 25 Hydroxy (Vit-D Deficiency, Fractures)  Shortness of breath on exertion - Plan: CBC With Differential, Lipid Panel With LDL/HDL Ratio, VITAMIN D 25 Hydroxy (Vit-D Deficiency, Fractures)  Prediabetes - Plan: Comprehensive metabolic panel, Hemoglobin A1c, Insulin, random  Other specified hypothyroidism - Plan: T3, T4, free, TSH  Depression screening  At risk for diabetes mellitus  Class 3 severe obesity with serious comorbidity and body mass index (BMI) of 40.0 to 44.9 in adult, unspecified obesity type (HCC)  PLAN:  Fatigue Grace Graves was informed that her fatigue may be related to obesity, depression or many other causes. We will check TSH (thyroid panel), CBC, CMP, B12, Folate, and Vit D, and in the meanwhile Grace Graves has agreed to work on diet, exercise and weight loss to help with fatigue. Proper sleep hygiene was discussed including the need for 7-8 hours of quality sleep each night. A sleep study was not ordered based on symptoms and Epworth score.  Dyspnea on exertion Grace Graves's shortness of breath appears to be obesity related and exercise induced. She has agreed to work on weight loss and gradually increase exercise to treat her exercise induced shortness of breath. If Grace Graves follows our instructions and loses weight without improvement of her shortness of breath, we will plan to refer to pulmonology. We will monitor this condition regularly, EKG and IC. Grace Graves agrees to this plan.  Pre-Diabetes Grace Graves will continue to  work on weight loss, exercise, and decreasing simple carbohydrates in her diet to help decrease the risk of diabetes. We dicussed metformin including benefits and risks. She was informed that eating too many simple carbohydrates or too many calories at one sitting increases the likelihood of GI side effects. Anitra declined metformin for now and a prescription was not written today. We will check Hgb A1c and insulin level today and Grace Graves agrees to follow up with our clinic in 2 weeks as directed to monitor her progress.  Diabetes risk counselling Mariangela was given extended (15 minutes) diabetes prevention counseling today. She is 47 y.o. female and has risk factors for diabetes including obesity and pre-diabetes. We discussed intensive lifestyle modifications today with an emphasis on weight loss as well as increasing exercise and decreasing simple carbohydrates in her diet.  Hypothyroidism Grace Graves was informed of the importance of good thyroid control to help with weight loss efforts. She was also informed that supertheraputic thyroid levels are dangerous and will not improve weight loss results. We will check thyroid panel and Shaleen agrees to follow up with our clinic in 2 weeks.  Depression Screen Khylei had a moderately positive depression screening. Depression is commonly associated with obesity and often results in emotional eating behaviors. We will monitor this closely and work on CBT to help improve the non-hunger eating patterns. Referral to Psychology may be required if no improvement is seen as she continues in our clinic.  Obesity Anberlyn is currently in the action stage of change and her goal is to continue with weight loss efforts. I recommend Cleota begin the structured treatment plan as follows:  She has agreed to follow the Category 3 plan + 100 calories Kendi has been instructed to eventually work up to a goal of 150 minutes of combined cardio and  strengthening exercise per week for weight loss and overall health benefits. We discussed the following  Behavioral Modification Strategies today: increasing lean protein intake, work on meal planning and easy cooking plans, dealing with family or coworker sabotage, and keeping healthy foods in the home   She was informed of the importance of frequent follow up visits to maximize her success with intensive lifestyle modifications for her multiple health conditions. She was informed we would discuss her lab results at her next visit unless there is a critical issue that needs to be addressed sooner. Lorriann agreed to keep her next visit at the agreed upon time to discuss these results.    OBESITY BEHAVIORAL INTERVENTION VISIT  Today's visit was # 1 out of 22.  Starting weight: 279 lbs Starting date: 03/25/17 Today's weight : 279 lbs  Today's date: 03/25/2017 Total lbs lost to date: 0 (Patients must lose 7 lbs in the first 6 months to continue with counseling)   ASK: We discussed the diagnosis of obesity with Priscella Mann today and Munirah agreed to give [place] permission to discuss obesity behavioral modification therapy today.  ASSESS: Edina has the diagnosis of obesity and her BMI today is 42.43 Latreece is in the action stage of change   ADVISE: Chanice was educated on the multiple health risks of obesity as well as the benefit of weight loss to improve her health. She was advised of the need for long term treatment and the importance of lifestyle modifications.  AGREE: Multiple dietary modification options and treatment options were discussed and  Shaunita agreed to the above obesity treatment plan.   I, Trixie Dredge, am acting as transcriptionist for Ilene Qua, MD   I have reviewed the above documentation for accuracy and completeness, and I agree with the above. - Ilene Qua, MD

## 2017-03-26 LAB — CBC WITH DIFFERENTIAL
Basophils Absolute: 0 10*3/uL (ref 0.0–0.2)
Basos: 1 %
EOS (ABSOLUTE): 0.2 10*3/uL (ref 0.0–0.4)
Eos: 3 %
Hematocrit: 39.3 % (ref 34.0–46.6)
Hemoglobin: 12.7 g/dL (ref 11.1–15.9)
IMMATURE GRANULOCYTES: 0 %
Immature Grans (Abs): 0 10*3/uL (ref 0.0–0.1)
LYMPHS ABS: 1.6 10*3/uL (ref 0.7–3.1)
Lymphs: 27 %
MCH: 26.6 pg (ref 26.6–33.0)
MCHC: 32.3 g/dL (ref 31.5–35.7)
MCV: 82 fL (ref 79–97)
MONOS ABS: 0.4 10*3/uL (ref 0.1–0.9)
Monocytes: 7 %
NEUTROS PCT: 62 %
Neutrophils Absolute: 3.7 10*3/uL (ref 1.4–7.0)
RBC: 4.78 x10E6/uL (ref 3.77–5.28)
RDW: 15.3 % (ref 12.3–15.4)
WBC: 5.9 10*3/uL (ref 3.4–10.8)

## 2017-03-26 LAB — COMPREHENSIVE METABOLIC PANEL
A/G RATIO: 1.3 (ref 1.2–2.2)
ALK PHOS: 98 IU/L (ref 39–117)
ALT: 25 IU/L (ref 0–32)
AST: 20 IU/L (ref 0–40)
Albumin: 4.3 g/dL (ref 3.5–5.5)
BUN/Creatinine Ratio: 14 (ref 9–23)
BUN: 11 mg/dL (ref 6–24)
Bilirubin Total: 0.3 mg/dL (ref 0.0–1.2)
CALCIUM: 9.5 mg/dL (ref 8.7–10.2)
CHLORIDE: 104 mmol/L (ref 96–106)
CO2: 22 mmol/L (ref 20–29)
Creatinine, Ser: 0.77 mg/dL (ref 0.57–1.00)
GFR calc Af Amer: 107 mL/min/{1.73_m2} (ref >59)
GFR, EST NON AFRICAN AMERICAN: 93 mL/min/{1.73_m2} (ref >59)
Globulin, Total: 3.2 g/dL (ref 1.5–4.5)
Glucose: 89 mg/dL (ref 65–99)
Potassium: 4.2 mmol/L (ref 3.5–5.2)
Sodium: 140 mmol/L (ref 134–144)
Total Protein: 7.5 g/dL (ref 6.0–8.5)

## 2017-03-26 LAB — T4, FREE: FREE T4: 1.51 ng/dL (ref 0.82–1.77)

## 2017-03-26 LAB — LIPID PANEL WITH LDL/HDL RATIO
Cholesterol, Total: 180 mg/dL (ref 100–199)
HDL: 46 mg/dL (ref >39)
LDL CALC: 117 mg/dL — AB (ref 0–99)
LDL/HDL RATIO: 2.5 ratio (ref 0.0–3.2)
Triglycerides: 85 mg/dL (ref 0–149)
VLDL Cholesterol Cal: 17 mg/dL (ref 5–40)

## 2017-03-26 LAB — HEMOGLOBIN A1C
ESTIMATED AVERAGE GLUCOSE: 117 mg/dL
HEMOGLOBIN A1C: 5.7 % — AB (ref 4.8–5.6)

## 2017-03-26 LAB — TSH: TSH: 1.04 u[IU]/mL (ref 0.450–4.500)

## 2017-03-26 LAB — VITAMIN B12: VITAMIN B 12: 641 pg/mL (ref 232–1245)

## 2017-03-26 LAB — INSULIN, RANDOM: INSULIN: 19.9 u[IU]/mL (ref 2.6–24.9)

## 2017-03-26 LAB — FOLATE: FOLATE: 9.8 ng/mL (ref >3.0)

## 2017-03-26 LAB — VITAMIN D 25 HYDROXY (VIT D DEFICIENCY, FRACTURES): VIT D 25 HYDROXY: 26 ng/mL — AB (ref 30.0–100.0)

## 2017-03-26 LAB — T3: T3 TOTAL: 128 ng/dL (ref 71–180)

## 2017-03-30 MED FILL — SYNTHROID 112 MCG TABLET: 112 | 30 days supply | Qty: 30 | Fill #5

## 2017-04-08 ENCOUNTER — Ambulatory Visit (INDEPENDENT_AMBULATORY_CARE_PROVIDER_SITE_OTHER): Payer: 59 | Admitting: Family Medicine

## 2017-04-08 VITALS — BP 131/81 | HR 79 | Temp 98.0°F | Ht 68.0 in | Wt 276.0 lb

## 2017-04-08 DIAGNOSIS — Z9189 Other specified personal risk factors, not elsewhere classified: Secondary | ICD-10-CM

## 2017-04-08 DIAGNOSIS — Z6841 Body Mass Index (BMI) 40.0 and over, adult: Secondary | ICD-10-CM | POA: Diagnosis not present

## 2017-04-08 DIAGNOSIS — E559 Vitamin D deficiency, unspecified: Secondary | ICD-10-CM | POA: Diagnosis not present

## 2017-04-08 DIAGNOSIS — E038 Other specified hypothyroidism: Secondary | ICD-10-CM | POA: Diagnosis not present

## 2017-04-08 DIAGNOSIS — R7303 Prediabetes: Secondary | ICD-10-CM | POA: Diagnosis not present

## 2017-04-08 MED ORDER — VITAMIN D (ERGOCALCIFEROL) 1.25 MG (50000 UNIT) PO CAPS: 50000.0000 [IU] | ORAL_CAPSULE | ORAL | 0 refills | Status: DC

## 2017-04-08 MED ORDER — METFORMIN HCL 500 MG PO TABS: 500.0000 mg | ORAL_TABLET | Freq: Every day | ORAL | 0 refills | Status: DC

## 2017-04-08 MED FILL — VIT D2 1.25 MG (50,000 UNIT: 1.25 MG | 28 days supply | Qty: 4 | Fill #0

## 2017-04-08 MED FILL — metFORMIN HCL 500 MG TABS: 500 | 30 days supply | Qty: 30 | Fill #0

## 2017-04-08 NOTE — Progress Notes (Signed)
Office: [phone]  /  Fax: [phone]   HPI:   Chief Complaint: OBESITY Grace Graves is here to discuss her progress with her obesity treatment plan. She is on the  follow the Category 3 plan +100 calories and is following her eating plan approximately 80 % of the time. She states she is doing cardio for 35-45 minutes 2-3 times per week. Grace Graves would eat all breakfast, lunch, and would occasionally skip portions of dinner especially the meat. For snacks she is using 100 calorie snack packs.  Her weight is 276 lb (125.2 kg) today and has had a weight loss of 3 pounds over a period of 2 weeks since her last visit. She has lost 3 lbs since starting treatment with [place].  Vitamin D deficiency Grace Graves has a diagnosis of vitamin D deficiency with a vit D level of 26. She is not currently taking vit D and denies nausea, vomiting or muscle weakness.  Pre-Diabetes Grace Graves has a diagnosis of prediabetes based on her elevated HgA1c of 5.7 and Insulin of 19.9 and was informed this puts her at greater risk of developing diabetes. She is not taking metformin currently and continues to work on diet and exercise to decrease risk of diabetes. She denies nausea or hypoglycemia. Discussed with her the possible side effects of diabetes, as well as the risk of progression to diabetes.   Hypothyroid Grace Graves has a diagnosis of hypothyroidism. Her TSH is within normal limits.  She is on levothyroxine 112 mcg daily currently She reports she is able to tell when she is over replaced. She denies hot or cold intolerance or palpitations, but does admit to ongoing fatigue.  At risk for diabetes Grace Graves is at higher than averagerisk for developing diabetes due to her obesity. She currently denies polyuria or polydipsia.  ALLERGIES: No Known Allergies  MEDICATIONS: Current Outpatient Medications on File Prior to Visit  Medication Sig Dispense Refill  . SYNTHROID 112 MCG tablet   2   No current  facility-administered medications on file prior to visit.     PAST MEDICAL HISTORY: Past Medical History:  Diagnosis Date  . Abnormal Pap smear    ASCUS-H +HPV 11/11, 8/12  [place], 5/13 ASCUS HPV-  . AMA (advanced maternal age) multigravida 21+   . Colon polyps   . Complication of anesthesia 12 yrs ago   woke up in middle of anesthesia for eye surgery  . Depression    pp depression x2  - no problems at present  . Depression, postpartum   . FHx: colon cancer   . Gallstones    3 or 4 gallbladder attacks - with abdominal pain, nausea and vomiting and reflux  . GERD (gastroesophageal reflux disease)   . H/O varicella   . Hypothyroidism   . 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  . Obese   . Vaginal delivery 2003, 2008, 2014    PAST SURGICAL HISTORY: Past Surgical History:  Procedure Laterality Date  . CHOLECYSTECTOMY N/A 04/18/2013   Procedure: LAPAROSCOPIC CHOLECYSTECTOMY WITH INTRAOPERATIVE CHOLANGIOGRAM;  Surgeon: Odis Hollingshead, MD;  Location: [facility];  Service: General;  Laterality: N/A;  . COLONOSCOPY WITH PROPOFOL N/A 05/02/2015   Procedure: COLONOSCOPY WITH PROPOFOL;  Surgeon: Arta Silence, MD;  Location: [facility];  Service: Endoscopy;  Laterality: N/A;  . COLPOSCOPY     2/12,8/12 HPV  changes only  . CYSTO N/A 05/21/2015   Procedure: CYSTO;  Surgeon: Megan Salon, MD;  Location:  [place] [facility];  Service: Gynecology;  Laterality: N/A;  . ESOPHAGOGASTRODUODENOSCOPY (EGD) WITH PROPOFOL N/A 05/02/2015   Procedure: ESOPHAGOGASTRODUODENOSCOPY (EGD) WITH PROPOFOL;  Surgeon: Arta Silence, MD;  [facility];  Service: Endoscopy;  Laterality: N/A;  . EYE SURGERY     L eye  . LAPAROSCOPIC BILATERAL SALPINGO OOPHERECTOMY Bilateral 05/21/2015   Procedure: LAPAROSCOPIC BILATERAL SALPINGO OOPHORECTOMY;  Surgeon: Megan Salon, MD;  Location: [place];  Service: Gynecology;  Laterality: Bilateral;  . LAPAROSCOPIC HYSTERECTOMY N/A  05/21/2015   Procedure: HYSTERECTOMY TOTAL LAPAROSCOPIC;  Surgeon: Megan Salon, MD;  Location: [facility];  Service: Gynecology;  Laterality: N/A;  . LAPAROSCOPY     for ovarian cyst  . MOUTH SURGERY    . TONSILLECTOMY      SOCIAL HISTORY: Social History   Tobacco Use  . Smoking status: Never Smoker  . Smokeless tobacco: Never Used  Substance Use Topics  . Alcohol use: No    Alcohol/week: 0.0 oz  . Drug use: No    FAMILY HISTORY: Family History  Problem Relation Age of Onset  . Arthritis Mother   . COPD Mother   . Cancer Mother        colon and lung  . Depression Mother   . Diabetes Mother   . Heart disease Mother   . Hyperlipidemia Mother   . Hypertension Mother   . Hypertension Father   . Depression Father   . Depression Sister   . Kidney disease Sister   . Hypothyroidism Sister   . Other Sister        enlarged heart  . Fibromyalgia Sister   . Heart attack Sister   . Colon cancer Brother   . Cancer Maternal Aunt        NOS  . Melanoma Paternal Uncle   . SIDS Brother   . Lung cancer Maternal Aunt     ROS: Review of Systems  Constitutional: Positive for malaise/fatigue and weight loss.  Cardiovascular: Negative for palpitations.  Gastrointestinal: Negative for nausea and vomiting.  Musculoskeletal:       Negative for muscle weakness  Endo/Heme/Allergies: Negative for polydipsia.       Negative for hypoglycemia  Negative for heat/cold intolerance  Negative for polyuria    PHYSICAL EXAM: Blood pressure 131/81, pulse 79, temperature 98 F (36.7 C), temperature source Oral, height '5\' 8"'$  (1.727 m), weight 276 lb (125.2 kg), last menstrual period 04/09/2015, SpO2 98 %. Body mass index is 41.97 kg/m. Physical Exam  Constitutional: She is oriented to person, place, and time. She appears well-developed and well-nourished.  HENT:  Head: Normocephalic.  Eyes: EOM are normal.  Neck: Normal range of motion.  Cardiovascular: Normal rate.  Pulmonary/Chest:  Effort normal.  Musculoskeletal: Normal range of motion.  Neurological: She is alert and oriented to person, place, and time.  Skin: Skin is warm and dry.  Psychiatric: She has a normal mood and affect. Her behavior is normal.  Vitals reviewed.   RECENT LABS AND TESTS: BMET    Component Value Date/Time   NA 140 03/25/2017 1011   K 4.2 03/25/2017 1011   CL 104 03/25/2017 1011   CO2 22 03/25/2017 1011   GLUCOSE 89 03/25/2017 1011   GLUCOSE 92 04/12/2013 1430   BUN 11 03/25/2017 1011   CREATININE 0.77 03/25/2017 1011   CALCIUM 9.5 03/25/2017 1011   GFRNONAA 93 03/25/2017 1011   GFRAA 107 03/25/2017 1011   Lab Results  Component Value Date   HGBA1C 5.7 (H)  03/25/2017   HGBA1C 6.1 (H) 04/13/2014   Lab Results  Component Value Date   INSULIN 19.9 03/25/2017   CBC    Component Value Date/Time   WBC 5.9 03/25/2017 1011   WBC 9.3 05/22/2015 0548   RBC 4.78 03/25/2017 1011   RBC 3.95 05/22/2015 0548   HGB 12.7 03/25/2017 1011   HCT 39.3 03/25/2017 1011   PLT 293 05/22/2015 0548   MCV 82 03/25/2017 1011   MCH 26.6 03/25/2017 1011   MCH 27.1 05/22/2015 0548   MCHC 32.3 03/25/2017 1011   MCHC 32.6 05/22/2015 0548   RDW 15.3 03/25/2017 1011   LYMPHSABS 1.6 03/25/2017 1011   EOSABS 0.2 03/25/2017 1011   BASOSABS 0.0 03/25/2017 1011   Iron/TIBC/Ferritin/ %Sat No results found for: IRON, TIBC, FERRITIN, IRONPCTSAT Lipid Panel     Component Value Date/Time   CHOL 180 03/25/2017 1011   TRIG 85 03/25/2017 1011   HDL 46 03/25/2017 1011   CHOLHDL 4.7 04/13/2014 1053   VLDL 27 04/13/2014 1053   LDLCALC 117 (H) 03/25/2017 1011   Hepatic Function Panel     Component Value Date/Time   PROT 7.5 03/25/2017 1011   ALBUMIN 4.3 03/25/2017 1011   AST 20 03/25/2017 1011   ALT 25 03/25/2017 1011   ALKPHOS 98 03/25/2017 1011   BILITOT 0.3 03/25/2017 1011      Component Value Date/Time   TSH 1.040 03/25/2017 1011   TSH 6.890 (H) 04/13/2014 1053    ASSESSMENT AND  PLAN: Vitamin D deficiency - Plan: Vitamin D, Ergocalciferol, (DRISDOL) 50000 units CAPS capsule  Prediabetes - Plan: metFORMIN (GLUCOPHAGE) 500 MG tablet  Other specified hypothyroidism  At risk for diabetes mellitus  Class 3 severe obesity with serious comorbidity and body mass index (BMI) of 40.0 to 44.9 in adult, unspecified obesity type ([place])  PLAN: Vitamin D Deficiency Renesme was informed that low vitamin D levels contributes to fatigue and are associated with obesity, breast, and colon cancer. She agrees to start to take prescription Vit D '@50'$ ,000 IU every week #4 with no refills and will follow up for routine testing of vitamin D, at least 2-3 times per year. She was informed of the risk of over-replacement of vitamin D and agrees to not increase her dose unless she discusses this with [place] first. She agrees to follow up in our clinic in 2 weeks.   Pre-Diabetes Grace Graves will continue to work on weight loss, exercise, and decreasing simple carbohydrates in her diet to help decrease the risk of diabetes. We dicussed metformin including benefits and risks. She was informed that eating too many simple carbohydrates or too many calories at one sitting increases the likelihood of GI side effects. Nikyla results were reviewed a prescription was written today for metformin 500 mg qAM #30 with no refills. Kampbell agreed to follow up with [place] as directed to monitor her progress.  Hypothyroid Grace Graves was informed of the importance of good thyroid control to help with weight loss efforts. She was also informed that supertheraputic thyroid levels are dangerous and will not improve weight loss results. She agrees to continue current levothyroxine. We will recheck TSH in 2 months.   Diabetes risk counseling Grace Graves was given extended (15 minutes) diabetes prevention counseling today. She is 47 y.o. female and has risk factors for diabetes including obesity. We discussed intensive lifestyle  modifications today with an emphasis on weight loss as well as increasing exercise and decreasing simple carbohydrates in her diet.  Obesity Grace Graves is  currently in the action stage of change. As such, her goal is to continue with weight loss efforts She has agreed to follow the Category 3 plan +100 calories Grace Graves has been instructed to work up to a goal of 150 minutes of combined cardio and strengthening exercise per week for weight loss and overall health benefits. We discussed the following Behavioral Modification Strategies today: increasing lean protein intake, increasing water intake, meal planning and cooking strategies, and keeping healthy foods in the home.    Grace Graves has agreed to follow up with our clinic in 2 weeks. She was informed of the importance of frequent follow up visits to maximize her success with intensive lifestyle modifications for her multiple health conditions.   OBESITY BEHAVIORAL INTERVENTION VISIT  Today's visit was # 2 out of 22.  Starting weight: 279 lbs Starting date: 03/25/17 Today's weight : 276 lbs Today's date: 04/08/2017 Total lbs lost to date: 3 (Patients must lose 7 lbs in the first 6 months to continue with counseling)   ASK: We discussed the diagnosis of obesity with Grace Graves today and Grace Graves agreed to give [place] permission to discuss obesity behavioral modification therapy today.  ASSESS: Grace Graves has the diagnosis of obesity and her BMI today is 41.97 Grace Graves is in the action stage of change   ADVISE: Grace Graves was educated on the multiple health risks of obesity as well as the benefit of weight loss to improve her health. She was advised of the need for long term treatment and the importance of lifestyle modifications.  AGREE: Multiple dietary modification options and treatment options were discussed and  Grace Graves agreed to the above obesity treatment plan.  I, Renee Ramus, am acting as transcriptionist for  Ilene Qua, MD     I have reviewed the above documentation for accuracy and completeness, and I agree with the above. - Ilene Qua, MD

## 2017-04-22 ENCOUNTER — Ambulatory Visit (INDEPENDENT_AMBULATORY_CARE_PROVIDER_SITE_OTHER): Payer: 59 | Admitting: Family Medicine

## 2017-04-22 VITALS — BP 97/64 | HR 66 | Temp 98.3°F | Ht 68.0 in | Wt 277.0 lb

## 2017-04-22 DIAGNOSIS — Z6841 Body Mass Index (BMI) 40.0 and over, adult: Secondary | ICD-10-CM | POA: Diagnosis not present

## 2017-04-22 DIAGNOSIS — E038 Other specified hypothyroidism: Secondary | ICD-10-CM

## 2017-04-22 DIAGNOSIS — R7303 Prediabetes: Secondary | ICD-10-CM | POA: Diagnosis not present

## 2017-04-22 NOTE — Progress Notes (Signed)
Office: [phone]  /  Fax: [phone]   HPI:   Chief Complaint: OBESITY Grace Graves is here to discuss her progress with her obesity treatment plan. She is on the Category 3 plan +100 calories and is following her eating plan approximately 40 % of the time. She states she is walking 30 minutes 3 times per week. Grace Graves went out of town the past 2 weekends and ate out the entire weekend. She is interested in other options for dinner. Her family wants to eat not just meat and vegetables. Her weight is 277 lb (125.6 kg) today and has had a weight gain of 1 pound over a period of 2 weeks since her last visit. She has lost 2 lbs since starting treatment with [place].  Hypothyroidism Grace Graves has a diagnosis of hypothyroidism. She is on synthroid. She denies hot or cold intolerance or palpitations.  Pre-Diabetes Grace Graves has a diagnosis of pre-diabetes based on her elevated Hgb A1c and was informed this puts her at greater risk of developing diabetes. She is taking metformin currently and denies any GI upset. Grace Graves continues to work on diet and exercise to decrease risk of diabetes. She denies polyuria, polydipsia or any hypoglycemic episodes.  ALLERGIES: No Known Allergies  MEDICATIONS: Current Outpatient Medications on File Prior to Visit  Medication Sig Dispense Refill  . metFORMIN (GLUCOPHAGE) 500 MG tablet Take 1 tablet (500 mg total) by mouth daily with breakfast. 30 tablet 0  . SYNTHROID 112 MCG tablet   2  . Vitamin D, Ergocalciferol, (DRISDOL) 50000 units CAPS capsule Take 1 capsule (50,000 Units total) by mouth every 7 (seven) days. 4 capsule 0   No current facility-administered medications on file prior to visit.     PAST MEDICAL HISTORY: Past Medical History:  Diagnosis Date  . Abnormal Pap smear    ASCUS-H +HPV 11/11, 8/12  [place], 5/13 ASCUS HPV-  . AMA (advanced maternal age) multigravida 62+   . Colon polyps   . Complication of anesthesia 12 yrs ago   woke up  in middle of anesthesia for eye surgery  . Depression    pp depression x2  - no problems at present  . Depression, postpartum   . FHx: colon cancer   . Gallstones    3 or 4 gallbladder attacks - with abdominal pain, nausea and vomiting and reflux  . GERD (gastroesophageal reflux disease)   . H/O varicella   . Hypothyroidism   . 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)    "genetic tendency to form cancer"-none at present.  . Obese   . Vaginal delivery 2003, 2008, 2014    PAST SURGICAL HISTORY: Past Surgical History:  Procedure Laterality Date  . CHOLECYSTECTOMY N/A 04/18/2013   Procedure: LAPAROSCOPIC CHOLECYSTECTOMY WITH INTRAOPERATIVE CHOLANGIOGRAM;  Surgeon: Odis Hollingshead, MD;  [facility];  Service: General;  Laterality: N/A;  . COLONOSCOPY WITH PROPOFOL N/A 05/02/2015   Procedure: COLONOSCOPY WITH PROPOFOL;  Surgeon: Arta Silence, MD;  [facility];  Service: Endoscopy;  Laterality: N/A;  . COLPOSCOPY     2/12,8/12 HPV  changes only  . CYSTO N/A 05/21/2015   Procedure: CYSTO;  Surgeon: Megan Salon, MD;  Location: [facility];  Service: Gynecology;  Laterality: N/A;  . ESOPHAGOGASTRODUODENOSCOPY (EGD) WITH PROPOFOL N/A 05/02/2015   Procedure: ESOPHAGOGASTRODUODENOSCOPY (EGD) WITH PROPOFOL;  Surgeon: Arta Silence, MD;  [facility];  Service: Endoscopy;  Laterality: N/A;  . EYE SURGERY     L eye  . LAPAROSCOPIC BILATERAL SALPINGO OOPHERECTOMY Bilateral  05/21/2015   Procedure: LAPAROSCOPIC BILATERAL SALPINGO OOPHORECTOMY;  Surgeon: Megan Salon, MD;  Location: [facility];  Service: Gynecology;  Laterality: Bilateral;  . LAPAROSCOPIC HYSTERECTOMY N/A 05/21/2015   Procedure: HYSTERECTOMY TOTAL LAPAROSCOPIC;  Surgeon: Megan Salon, MD;  Location: [facility];  Service: Gynecology;  Laterality: N/A;  . LAPAROSCOPY     for ovarian cyst  . MOUTH SURGERY    . TONSILLECTOMY      SOCIAL HISTORY: Social History   Tobacco Use  . Smoking status: Never Smoker  . Smokeless  tobacco: Never Used  Substance Use Topics  . Alcohol use: No    Alcohol/week: 0.0 oz  . Drug use: No    FAMILY HISTORY: Family History  Problem Relation Age of Onset  . Arthritis Mother   . COPD Mother   . Cancer Mother        colon and lung  . Depression Mother   . Diabetes Mother   . Heart disease Mother   . Hyperlipidemia Mother   . Hypertension Mother   . Hypertension Father   . Depression Father   . Depression Sister   . Kidney disease Sister   . Hypothyroidism Sister   . Other Sister        enlarged heart  . Fibromyalgia Sister   . Heart attack Sister   . Colon cancer Brother   . Cancer Maternal Aunt        NOS  . Melanoma Paternal Uncle   . SIDS Brother   . Lung cancer Maternal Aunt     ROS: Review of Systems  Constitutional: Negative for weight loss.  Cardiovascular: Negative for palpitations.  Gastrointestinal: Negative for diarrhea and nausea.  Genitourinary: Negative for frequency.  Endo/Heme/Allergies: Negative for polydipsia.       Negative for heat or cold intolerance Negative for hypoglycemia    PHYSICAL EXAM: Blood pressure 97/64, pulse 66, temperature 98.3 F (36.8 C), temperature source Oral, height '5\' 8"'$  (1.727 m), weight 277 lb (125.6 kg), last menstrual period 04/09/2015, SpO2 96 %. Body mass index is 42.12 kg/m. Physical Exam  Constitutional: She is oriented to person, place, and time. She appears well-developed and well-nourished.  Cardiovascular: Normal rate.  Pulmonary/Chest: Effort normal.  Musculoskeletal: Normal range of motion.  Neurological: She is oriented to person, place, and time.  Skin: Skin is warm and dry.  Psychiatric: She has a normal mood and affect. Her behavior is normal.  Vitals reviewed.   RECENT LABS AND TESTS: BMET    Component Value Date/Time   NA 140 03/25/2017 1011   K 4.2 03/25/2017 1011   CL 104 03/25/2017 1011   CO2 22 03/25/2017 1011   GLUCOSE 89 03/25/2017 1011   GLUCOSE 92 04/12/2013 1430    BUN 11 03/25/2017 1011   CREATININE 0.77 03/25/2017 1011   CALCIUM 9.5 03/25/2017 1011   GFRNONAA 93 03/25/2017 1011   GFRAA 107 03/25/2017 1011   Lab Results  Component Value Date   HGBA1C 5.7 (H) 03/25/2017   HGBA1C 6.1 (H) 04/13/2014   Lab Results  Component Value Date   INSULIN 19.9 03/25/2017   CBC    Component Value Date/Time   WBC 5.9 03/25/2017 1011   WBC 9.3 05/22/2015 0548   RBC 4.78 03/25/2017 1011   RBC 3.95 05/22/2015 0548   HGB 12.7 03/25/2017 1011   HCT 39.3 03/25/2017 1011   PLT 293 05/22/2015 0548   MCV 82 03/25/2017 1011   MCH 26.6 03/25/2017 1011  MCH 27.1 05/22/2015 0548   MCHC 32.3 03/25/2017 1011   MCHC 32.6 05/22/2015 0548   RDW 15.3 03/25/2017 1011   LYMPHSABS 1.6 03/25/2017 1011   EOSABS 0.2 03/25/2017 1011   BASOSABS 0.0 03/25/2017 1011   Iron/TIBC/Ferritin/ %Sat No results found for: IRON, TIBC, FERRITIN, IRONPCTSAT Lipid Panel     Component Value Date/Time   CHOL 180 03/25/2017 1011   TRIG 85 03/25/2017 1011   HDL 46 03/25/2017 1011   CHOLHDL 4.7 04/13/2014 1053   VLDL 27 04/13/2014 1053   LDLCALC 117 (H) 03/25/2017 1011   Hepatic Function Panel     Component Value Date/Time   PROT 7.5 03/25/2017 1011   ALBUMIN 4.3 03/25/2017 1011   AST 20 03/25/2017 1011   ALT 25 03/25/2017 1011   ALKPHOS 98 03/25/2017 1011   BILITOT 0.3 03/25/2017 1011      Component Value Date/Time   TSH 1.040 03/25/2017 1011   TSH 6.890 (H) 04/13/2014 1053    ASSESSMENT AND PLAN: Other specified hypothyroidism  Pre-diabetes  Class 3 severe obesity with serious comorbidity and body mass index (BMI) of 40.0 to 44.9 in adult, unspecified obesity type ([place])  PLAN:  Hypothyroidism Grace Graves was informed of the importance of good thyroid control to help with weight loss efforts. She was also informed that supertheraputic thyroid levels are dangerous and will not improve weight loss results. Grace Graves will continue synthroid 112 mcg daily and will  follow up as directed.  Pre-Diabetes Grace Graves will continue to work on weight loss, exercise, and decreasing simple carbohydrates in her diet to help decrease the risk of diabetes. We dicussed metformin including benefits and risks. She was informed that eating too many simple carbohydrates or too many calories at one sitting increases the likelihood of GI side effects. Grace Graves agrees to continue metformin for now and a prescription was not written today. Grace Graves agreed to follow up with [place] as directed to monitor her progress.  We spent > than 50% of the 15 minute visit on the counseling as documented in the note.  Obesity Grace Graves is currently in the action stage of change. As such, her goal is to continue with weight loss efforts She has agreed to keep a food journal with 450 to 600 calories and 40 grams of protein at supper daily and follow the Category 3 plan +100 calories for breakfast and lunch daily. Grace Graves has been instructed to work up to a goal of 150 minutes of combined cardio and strengthening exercise per week for weight loss and overall health benefits. We discussed the following Behavioral Modification Strategies today: better snacking choices, planning for success, keep a strict food journal, increasing lean protein intake and increasing vegetables  Grace Graves has agreed to follow up with our clinic in 2 weeks. She was informed of the importance of frequent follow up visits to maximize her success with intensive lifestyle modifications for her multiple health conditions.   OBESITY BEHAVIORAL INTERVENTION VISIT  Today's visit was # 3 out of 22.  Starting weight: 279 lbs Starting date: 03/25/17 Today's weight : 277 lbs Today's date: 04/22/2017 Total lbs lost to date: 2 (Patients must lose 7 lbs in the first 6 months to continue with counseling)   ASK: We discussed the diagnosis of obesity with Grace Graves today and Grace Graves agreed to give [place] permission to  discuss obesity behavioral modification therapy today.  ASSESS: Grace Graves has the diagnosis of obesity and her BMI today is 42.13 Grace Graves is in the action stage  of change   ADVISE: Grace Graves was educated on the multiple health risks of obesity as well as the benefit of weight loss to improve her health. She was advised of the need for long term treatment and the importance of lifestyle modifications.  AGREE: Multiple dietary modification options and treatment options were discussed and  Grace Graves agreed to the above obesity treatment plan.  I, Doreene Nest, am acting as transcriptionist for Eber Jones, MD  I have reviewed the above documentation for accuracy and completeness, and I agree with the above. - Ilene Qua, MD

## 2017-04-27 MED FILL — SYNTHROID 112 MCG TABLET: 112 | 30 days supply | Qty: 30 | Fill #6

## 2017-05-06 ENCOUNTER — Ambulatory Visit (INDEPENDENT_AMBULATORY_CARE_PROVIDER_SITE_OTHER): Payer: 59 | Admitting: Family Medicine

## 2017-05-06 VITALS — BP 130/85 | HR 67 | Ht 68.0 in | Wt 275.0 lb

## 2017-05-06 DIAGNOSIS — E559 Vitamin D deficiency, unspecified: Secondary | ICD-10-CM | POA: Diagnosis not present

## 2017-05-06 DIAGNOSIS — R7303 Prediabetes: Secondary | ICD-10-CM

## 2017-05-06 DIAGNOSIS — Z9189 Other specified personal risk factors, not elsewhere classified: Secondary | ICD-10-CM

## 2017-05-06 DIAGNOSIS — Z6841 Body Mass Index (BMI) 40.0 and over, adult: Secondary | ICD-10-CM

## 2017-05-06 MED ORDER — METFORMIN HCL 500 MG PO TABS: 500.0000 mg | ORAL_TABLET | Freq: Every day | ORAL | 0 refills | Status: DC

## 2017-05-06 MED ORDER — VITAMIN D (ERGOCALCIFEROL) 1.25 MG (50000 UNIT) PO CAPS: 50000.0000 [IU] | ORAL_CAPSULE | ORAL | 0 refills | Status: DC

## 2017-05-06 MED FILL — metFORMIN HCL 500 MG TABS: 500 | 30 days supply | Qty: 30 | Fill #0

## 2017-05-06 MED FILL — VIT D2 1.25 MG (50,000 UNIT: 1.25 MG | 28 days supply | Qty: 4 | Fill #0

## 2017-05-06 NOTE — Progress Notes (Signed)
Office: [phone]  /  Fax: [phone]   HPI:   Chief Complaint: OBESITY Grace Graves is here to discuss her progress with her obesity treatment plan. She is on the  keep a food journal with 450-600 calories and 40 grams of protein at supper daily and follow the Category 3 plan + 100 calories for breakfast and lunch and is following her eating plan approximately 50 % of the time. She states she walks around [facility] for 28 minutes 5 times per week. Grace Graves is starting to exercise with walking around [facility]. She is going to start water aerobics 2 times per week this week.  Her weight is 275 lb (124.7 kg) today and has had a weight loss of 2 pounds over a period of 2 weeks since her last visit. She has lost 4 lbs since starting treatment with [place].  Vitamin D Deficiency Grace Graves has a diagnosis of vitamin D deficiency. She is currently taking prescription Vit D and denies nausea, vomiting or muscle weakness.  At risk for osteopenia and osteoporosis Grace Graves is at higher risk of osteopenia and osteoporosis due to vitamin D deficiency.   Pre-Diabetes Grace Graves has a diagnosis of pre-diabetes based on her elevated Hgb A1c and was informed this puts her at greater risk of developing diabetes. She is taking metformin currently and continues to work on diet and exercise to decrease risk of diabetes. She denies side effects of diarrhea or GI upset, and she denies nausea or hypoglycemia.  ALLERGIES: No Known Allergies  MEDICATIONS: Current Outpatient Medications on File Prior to Visit  Medication Sig Dispense Refill  . SYNTHROID 112 MCG tablet   2   No current facility-administered medications on file prior to visit.     PAST MEDICAL HISTORY: Past Medical History:  Diagnosis Date  . Abnormal Pap smear    ASCUS-H +HPV 11/11, 8/12  [place], 5/13 ASCUS HPV-  . AMA (advanced maternal age) multigravida 51+   . Colon polyps   . Complication of anesthesia 12 yrs ago   woke  up in middle of anesthesia for eye surgery  . Depression    pp depression x2  - no problems at present  . Depression, postpartum   . FHx: colon cancer   . Gallstones    3 or 4 gallbladder attacks - with abdominal pain, nausea and vomiting and reflux  . GERD (gastroesophageal reflux disease)   . H/O varicella   . Hypothyroidism   . 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  . Obese   . Vaginal delivery 2003, 2008, 2014    PAST SURGICAL HISTORY: Past Surgical History:  Procedure Laterality Date  . CHOLECYSTECTOMY N/A 04/18/2013   Procedure: LAPAROSCOPIC CHOLECYSTECTOMY WITH INTRAOPERATIVE CHOLANGIOGRAM;  Surgeon: Odis Hollingshead, MD;  Location: [facility];  Service: General;  Laterality: N/A;  . COLONOSCOPY WITH PROPOFOL N/A 05/02/2015   Procedure: COLONOSCOPY WITH PROPOFOL;  Surgeon: Arta Silence, MD;  [facility];  Service: Endoscopy;  Laterality: N/A;  . COLPOSCOPY     2/12,8/12 HPV  changes only  . CYSTO N/A 05/21/2015   Procedure: CYSTO;  Surgeon: Megan Salon, MD;  Location: [place] [facility];  Service: Gynecology;  Laterality: N/A;  . ESOPHAGOGASTRODUODENOSCOPY (EGD) WITH PROPOFOL N/A 05/02/2015   Procedure: ESOPHAGOGASTRODUODENOSCOPY (EGD) WITH PROPOFOL;  Surgeon: Arta Silence, MD;  [facility];  Service: Endoscopy;  Laterality: N/A;  . EYE SURGERY     L eye  . LAPAROSCOPIC BILATERAL  SALPINGO OOPHERECTOMY Bilateral 05/21/2015   Procedure: LAPAROSCOPIC BILATERAL SALPINGO OOPHORECTOMY;  Surgeon: Megan Salon, MD;  Location: [place] [facility];  Service: Gynecology;  Laterality: Bilateral;  . LAPAROSCOPIC HYSTERECTOMY N/A 05/21/2015   Procedure: HYSTERECTOMY TOTAL LAPAROSCOPIC;  Surgeon: Megan Salon, MD;  Location: [place] [facility];  Service: Gynecology;  Laterality: N/A;  . LAPAROSCOPY     for ovarian cyst  . MOUTH SURGERY    . TONSILLECTOMY      SOCIAL HISTORY: Social History   Tobacco Use  . Smoking status: Never Smoker  .  Smokeless tobacco: Never Used  Substance Use Topics  . Alcohol use: No    Alcohol/week: 0.0 oz  . Drug use: No    FAMILY HISTORY: Family History  Problem Relation Age of Onset  . Arthritis Mother   . COPD Mother   . Cancer Mother        colon and lung  . Depression Mother   . Diabetes Mother   . Heart disease Mother   . Hyperlipidemia Mother   . Hypertension Mother   . Hypertension Father   . Depression Father   . Depression Sister   . Kidney disease Sister   . Hypothyroidism Sister   . Other Sister        enlarged heart  . Fibromyalgia Sister   . Heart attack Sister   . Colon cancer Brother   . Cancer Maternal Aunt        NOS  . Melanoma Paternal Uncle   . SIDS Brother   . Lung cancer Maternal Aunt     ROS: Review of Systems  Constitutional: Positive for weight loss.  Gastrointestinal: Negative for diarrhea, nausea and vomiting.  Musculoskeletal:       Negative muscle weakness  Endo/Heme/Allergies:       Negative hypoglycemia    PHYSICAL EXAM: Blood pressure 130/85, pulse 67, height 5' 8" (1.727 m), weight 275 lb (124.7 kg), last menstrual period 04/09/2015, SpO2 97 %. Body mass index is 41.81 kg/m. Physical Exam  Constitutional: She is oriented to person, place, and time. She appears well-developed and well-nourished.  Cardiovascular: Normal rate.  Pulmonary/Chest: Effort normal.  Musculoskeletal: Normal range of motion.  Neurological: She is oriented to person, place, and time.  Skin: Skin is warm and dry.  Psychiatric: She has a normal mood and affect. Her behavior is normal.  Vitals reviewed.   RECENT LABS AND TESTS: BMET    Component Value Date/Time   NA 140 03/25/2017 1011   K 4.2 03/25/2017 1011   CL 104 03/25/2017 1011   CO2 22 03/25/2017 1011   GLUCOSE 89 03/25/2017 1011   GLUCOSE 92 04/12/2013 1430   BUN 11 03/25/2017 1011   CREATININE 0.77 03/25/2017 1011   CALCIUM 9.5 03/25/2017 1011   GFRNONAA 93 03/25/2017 1011   GFRAA 107  03/25/2017 1011   Lab Results  Component Value Date   HGBA1C 5.7 (H) 03/25/2017   HGBA1C 6.1 (H) 04/13/2014   Lab Results  Component Value Date   INSULIN 19.9 03/25/2017   CBC    Component Value Date/Time   WBC 5.9 03/25/2017 1011   WBC 9.3 05/22/2015 0548   RBC 4.78 03/25/2017 1011   RBC 3.95 05/22/2015 0548   HGB 12.7 03/25/2017 1011   HCT 39.3 03/25/2017 1011   PLT 293 05/22/2015 0548   MCV 82 03/25/2017 1011   MCH 26.6 03/25/2017 1011   MCH 27.1 05/22/2015 0548   MCHC 32.3 03/25/2017 1011   MCHC  32.6 05/22/2015 0548   RDW 15.3 03/25/2017 1011   LYMPHSABS 1.6 03/25/2017 1011   EOSABS 0.2 03/25/2017 1011   BASOSABS 0.0 03/25/2017 1011   Iron/TIBC/Ferritin/ %Sat No results found for: IRON, TIBC, FERRITIN, IRONPCTSAT Lipid Panel     Component Value Date/Time   CHOL 180 03/25/2017 1011   TRIG 85 03/25/2017 1011   HDL 46 03/25/2017 1011   CHOLHDL 4.7 04/13/2014 1053   VLDL 27 04/13/2014 1053   LDLCALC 117 (H) 03/25/2017 1011   Hepatic Function Panel     Component Value Date/Time   PROT 7.5 03/25/2017 1011   ALBUMIN 4.3 03/25/2017 1011   AST 20 03/25/2017 1011   ALT 25 03/25/2017 1011   ALKPHOS 98 03/25/2017 1011   BILITOT 0.3 03/25/2017 1011      Component Value Date/Time   TSH 1.040 03/25/2017 1011   TSH 6.890 (H) 04/13/2014 1053  Results for EIMY, PLAZA (MRN [id number]) as of 05/06/2017 12:18  Ref. Range 03/25/2017 10:11  Vitamin D, 25-Hydroxy Latest Ref Range: 30.0 - 100.0 ng/mL 26.0 (L)    ASSESSMENT AND PLAN: At risk for osteoporosis  Vitamin D deficiency - Plan: Vitamin D, Ergocalciferol, (DRISDOL) 50000 units CAPS capsule  Prediabetes - Plan: metFORMIN (GLUCOPHAGE) 500 MG tablet  Class 3 severe obesity with serious comorbidity and body mass index (BMI) of 40.0 to 44.9 in adult, unspecified obesity type ([place])  PLAN:  Vitamin D Deficiency Grace Graves was informed that low vitamin D levels contributes to fatigue and are associated with  obesity, breast, and colon cancer. Grace Graves agrees to continue taking prescription Vit D _0 ,000 IU every week #4 with no refills. She will follow up for routine testing of vitamin D, at least 2-3 times per year. She was informed of the risk of over-replacement of vitamin D and agrees to not increase her dose unless she discusses this with [place] first. Grace Graves agrees to follow up with our clinic in 2 weeks.  At risk for osteopenia and osteoporosis Grace Graves is at risk for osteopenia and osteoporsis due to her vitamin D deficiency. She was encouraged to take her vitamin D and follow her higher calcium diet and increase strengthening exercise to help strengthen her bones and decrease her risk of osteopenia and osteoporosis.  Pre-Diabetes Grace Graves will continue to work on weight loss, exercise, and decreasing simple carbohydrates in her diet to help decrease the risk of diabetes. We dicussed metformin including benefits and risks. She was informed that eating too many simple carbohydrates or too many calories at one sitting increases the likelihood of GI side effects. Grace Graves agrees to continue taking metformin 500 mg PO q AM #30 with no refills. Grace Graves agrees to follow up with our clinic in 2 weeks as directed to monitor her progress.  Obesity Grace Graves is currently in the action stage of change. As such, her goal is to continue with weight loss efforts She has agreed to follow the Category 3 plan + 200 calories, will add additional 100 calories for snack Grace Graves has been instructed to work up to a goal of 150 minutes of combined cardio and strengthening exercise per week for weight loss and overall health benefits. We discussed the following Behavioral Modification Strategies today: increasing lean protein intake, work on meal planning and easy cooking plans, and no skipping meals    Grace Graves has agreed to follow up with our clinic in 2 weeks. She was informed of the importance of frequent  follow up visits to maximize her success with intensive  lifestyle modifications for her multiple health conditions.   OBESITY BEHAVIORAL INTERVENTION VISIT  Today's visit was # 4 out of 22.  Starting weight: 279 lbs Starting date: 03/25/17 Today's weight : 275 lbs Today's date: 05/06/2017 Total lbs lost to date: 4 (Patients must lose 7 lbs in the first 6 months to continue with counseling)   ASK: We discussed the diagnosis of obesity with Grace Graves today and Grace Graves agreed to give [place] permission to discuss obesity behavioral modification therapy today.  ASSESS: Grace Graves has the diagnosis of obesity and her BMI today is 41.82 Grace Graves is in the action stage of change   ADVISE: Grace Graves was educated on the multiple health risks of obesity as well as the benefit of weight loss to improve her health. She was advised of the need for long term treatment and the importance of lifestyle modifications.  AGREE: Multiple dietary modification options and treatment options were discussed and  Rollande agreed to the above obesity treatment plan.  I, Grace Graves, am acting as transcriptionist for Grace Qua, MD  I have reviewed the above documentation for accuracy and completeness, and I agree with the above. - Grace Qua, MD

## 2017-05-21 ENCOUNTER — Ambulatory Visit (INDEPENDENT_AMBULATORY_CARE_PROVIDER_SITE_OTHER): Payer: 59 | Admitting: Family Medicine

## 2017-05-21 VITALS — BP 101/67 | HR 71 | Temp 97.8°F | Ht 68.0 in | Wt 272.0 lb

## 2017-05-21 DIAGNOSIS — R7303 Prediabetes: Secondary | ICD-10-CM | POA: Diagnosis not present

## 2017-05-21 DIAGNOSIS — Z6841 Body Mass Index (BMI) 40.0 and over, adult: Secondary | ICD-10-CM

## 2017-05-21 DIAGNOSIS — E559 Vitamin D deficiency, unspecified: Secondary | ICD-10-CM

## 2017-05-21 NOTE — Progress Notes (Signed)
Office: [phone]  /  Fax: [phone]   HPI:   Chief Complaint: OBESITY Grace Graves is here to discuss her progress with her obesity treatment plan. She is on the Category 3 plan + 200 calories and is following her eating plan approximately 50 % of the time. She states she is doing water aerobics for 60 minutes 2 times per week. Grace Graves is finding getting all food in during the day to be difficult. Started doing water aerobics through [employer].  Her weight is 272 lb (123.4 kg) today and has had a weight loss of 3 pounds over a period of 2 weeks since her last visit. She has lost 7 lbs since starting treatment with [place].  Vitamin D Deficiency Grace Graves has a diagnosis of vitamin D deficiency. She is currently taking prescription Vit D. She notes improving fatigue and denies nausea, vomiting or muscle weakness.  Pre-Diabetes Grace Graves has a diagnosis of pre-diabetes based on her elevated Hgb A1c and was informed this puts her at greater risk of developing diabetes. She is taking metformin currently and continues to work on diet and exercise to decrease risk of diabetes. She denies carbohydrate cravings or nausea or hypoglycemia.  ALLERGIES: No Known Allergies  MEDICATIONS: Current Outpatient Medications on File Prior to Visit  Medication Sig Dispense Refill  . metFORMIN (GLUCOPHAGE) 500 MG tablet Take 1 tablet (500 mg total) by mouth daily with breakfast. 30 tablet 0  . SYNTHROID 112 MCG tablet   2  . Vitamin D, Ergocalciferol, (DRISDOL) 50000 units CAPS capsule Take 1 capsule (50,000 Units total) by mouth every 7 (seven) days. 4 capsule 0   No current facility-administered medications on file prior to visit.     PAST MEDICAL HISTORY: Past Medical History:  Diagnosis Date  . Abnormal Pap smear    ASCUS-H +HPV 11/11, 8/12  [place], 5/13 ASCUS HPV-  . AMA (advanced maternal age) multigravida 42+   . Colon polyps   . Complication of anesthesia 12 yrs ago   woke up in middle of  anesthesia for eye surgery  . Depression    pp depression x2  - no problems at present  . Depression, postpartum   . FHx: colon cancer   . Gallstones    3 or 4 gallbladder attacks - with abdominal pain, nausea and vomiting and reflux  . GERD (gastroesophageal reflux disease)   . H/O varicella   . Hypothyroidism   . 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  . Obese   . Vaginal delivery 2003, 2008, 2014    PAST SURGICAL HISTORY: Past Surgical History:  Procedure Laterality Date  . CHOLECYSTECTOMY N/A 04/18/2013   Procedure: LAPAROSCOPIC CHOLECYSTECTOMY WITH INTRAOPERATIVE CHOLANGIOGRAM;  Surgeon: Odis Hollingshead, MD;  [facility];  Service: General;  Laterality: N/A;  . COLONOSCOPY WITH PROPOFOL N/A 05/02/2015   Procedure: COLONOSCOPY WITH PROPOFOL;  Surgeon: Arta Silence, MD;  [facility];  Service: Endoscopy;  Laterality: N/A;  . COLPOSCOPY     2/12,8/12 HPV  changes only  . CYSTO N/A 05/21/2015   Procedure: CYSTO;  Surgeon: Grace Graves Salon, MD;  Location: [place] [facility];  Service: Gynecology;  Laterality: N/A;  . ESOPHAGOGASTRODUODENOSCOPY (EGD) WITH PROPOFOL N/A 05/02/2015   Procedure: ESOPHAGOGASTRODUODENOSCOPY (EGD) WITH PROPOFOL;  Surgeon: Arta Silence, MD;  [facility];  Service: Endoscopy;  Laterality: N/A;  . EYE SURGERY     L eye  . LAPAROSCOPIC BILATERAL SALPINGO OOPHERECTOMY Bilateral 05/21/2015   Procedure:  LAPAROSCOPIC BILATERAL SALPINGO OOPHORECTOMY;  Surgeon: Grace Graves Salon, MD;  Location: [place] [facility];  Service: Gynecology;  Laterality: Bilateral;  . LAPAROSCOPIC HYSTERECTOMY N/A 05/21/2015   Procedure: HYSTERECTOMY TOTAL LAPAROSCOPIC;  Surgeon: Grace Graves Salon, MD;  Location: [place] [facility];  Service: Gynecology;  Laterality: N/A;  . LAPAROSCOPY     for ovarian cyst  . MOUTH SURGERY    . TONSILLECTOMY      SOCIAL HISTORY: Social History   Tobacco Use  . Smoking status: Never Smoker  . Smokeless tobacco: Never  Used  Substance Use Topics  . Alcohol use: No    Alcohol/week: 0.0 oz  . Drug use: No    FAMILY HISTORY: Family History  Problem Relation Age of Onset  . Arthritis Mother   . COPD Mother   . Cancer Mother        colon and lung  . Depression Mother   . Diabetes Mother   . Heart disease Mother   . Hyperlipidemia Mother   . Hypertension Mother   . Hypertension Father   . Depression Father   . Depression Sister   . Kidney disease Sister   . Hypothyroidism Sister   . Other Sister        enlarged heart  . Fibromyalgia Sister   . Heart attack Sister   . Colon cancer Brother   . Cancer Maternal Aunt        NOS  . Melanoma Paternal Uncle   . SIDS Brother   . Lung cancer Maternal Aunt     ROS: Review of Systems  Constitutional: Positive for malaise/fatigue and weight loss.  Gastrointestinal: Negative for nausea and vomiting.  Musculoskeletal:       Negative muscle weakness  Endo/Heme/Allergies:       Negative hypoglycemia    PHYSICAL EXAM: Blood pressure 101/67, pulse 71, temperature 97.8 F (36.6 C), temperature source Oral, height '5\' 8"'$  (1.727 m), weight 272 lb (123.4 kg), last menstrual period 04/09/2015, SpO2 96 %. Body mass index is 41.36 kg/m. Physical Exam  Constitutional: She is oriented to person, place, and time. She appears well-developed and well-nourished.  Cardiovascular: Normal rate.  Pulmonary/Chest: Effort normal.  Musculoskeletal: Normal range of motion.  Neurological: She is oriented to person, place, and time.  Skin: Skin is warm and dry.  Psychiatric: She has a normal mood and affect. Her behavior is normal.  Vitals reviewed.   RECENT LABS AND TESTS: BMET    Component Value Date/Time   NA 140 03/25/2017 1011   K 4.2 03/25/2017 1011   CL 104 03/25/2017 1011   CO2 22 03/25/2017 1011   GLUCOSE 89 03/25/2017 1011   GLUCOSE 92 04/12/2013 1430   BUN 11 03/25/2017 1011   CREATININE 0.77 03/25/2017 1011   CALCIUM 9.5 03/25/2017 1011    GFRNONAA 93 03/25/2017 1011   GFRAA 107 03/25/2017 1011   Lab Results  Component Value Date   HGBA1C 5.7 (H) 03/25/2017   HGBA1C 6.1 (H) 04/13/2014   Lab Results  Component Value Date   INSULIN 19.9 03/25/2017   CBC    Component Value Date/Time   WBC 5.9 03/25/2017 1011   WBC 9.3 05/22/2015 0548   RBC 4.78 03/25/2017 1011   RBC 3.95 05/22/2015 0548   HGB 12.7 03/25/2017 1011   HCT 39.3 03/25/2017 1011   PLT 293 05/22/2015 0548   MCV 82 03/25/2017 1011   MCH 26.6 03/25/2017 1011   MCH 27.1 05/22/2015 0548   MCHC 32.3 03/25/2017 1011  MCHC 32.6 05/22/2015 0548   RDW 15.3 03/25/2017 1011   LYMPHSABS 1.6 03/25/2017 1011   EOSABS 0.2 03/25/2017 1011   BASOSABS 0.0 03/25/2017 1011   Iron/TIBC/Ferritin/ %Sat No results found for: IRON, TIBC, FERRITIN, IRONPCTSAT Lipid Panel     Component Value Date/Time   CHOL 180 03/25/2017 1011   TRIG 85 03/25/2017 1011   HDL 46 03/25/2017 1011   CHOLHDL 4.7 04/13/2014 1053   VLDL 27 04/13/2014 1053   LDLCALC 117 (H) 03/25/2017 1011   Hepatic Function Panel     Component Value Date/Time   PROT 7.5 03/25/2017 1011   ALBUMIN 4.3 03/25/2017 1011   AST 20 03/25/2017 1011   ALT 25 03/25/2017 1011   ALKPHOS 98 03/25/2017 1011   BILITOT 0.3 03/25/2017 1011      Component Value Date/Time   TSH 1.040 03/25/2017 1011   TSH 6.890 (H) 04/13/2014 1053  Results for VALLERI, HENDRICKSEN (MRN [id number]) as of 05/21/2017 13:05  Ref. Range 03/25/2017 10:11  Vitamin D, 25-Hydroxy Latest Ref Range: 30.0 - 100.0 ng/mL 26.0 (L)    ASSESSMENT AND PLAN: Vitamin D deficiency  Pre-diabetes  Class 3 severe obesity with serious comorbidity and body mass index (BMI) of 40.0 to 44.9 in adult, unspecified obesity type ([place])  PLAN:  Vitamin D Deficiency Danniel was informed that low vitamin D levels contributes to fatigue and are associated with obesity, breast, and colon cancer. Grace Graves agrees to continue taking prescription Vit D '@50'$ ,000 IU  every week and will follow up for routine testing of vitamin D, at least 2-3 times per year. She was informed of the risk of over-replacement of vitamin D and agrees to not increase her dose unless she discusses this with [place] first. Grace Graves agrees to follow up with our clinic in 2 weeks.  Pre-Diabetes Grace Graves will continue to work on weight loss, exercise, and decreasing simple carbohydrates in her diet to help decrease the risk of diabetes. We dicussed metformin including benefits and risks. She was informed that eating too many simple carbohydrates or too many calories at one sitting increases the likelihood of GI side effects. Grace Graves agrees to continue taking metformin 500 mg PO q AM. Grace Graves agrees to follow up with our clinic in 2 weeks as directed to monitor her progress.  We spent > than 50% of the 15 minute visit on the counseling as documented in the note.  Obesity Grace Graves is currently in the action stage of change. As such, her goal is to continue with weight loss efforts She has agreed to follow the Category 3 plan + 200 calories Grace Graves has been instructed to work up to a goal of 150 minutes of combined cardio and strengthening exercise per week for weight loss and overall health benefits. We discussed the following Behavioral Modification Strategies today: increasing lean protein intake, increasing vegetables, work on meal planning and easy cooking plans, keeping healthy foods in the home, better snacking choices, and planning for success Resistance training talk at next appointment. May switch lunch and dinner.  Grace Graves has agreed to follow up with our clinic in 2 weeks. She was informed of the importance of frequent follow up visits to maximize her success with intensive lifestyle modifications for her multiple health conditions.   OBESITY BEHAVIORAL INTERVENTION VISIT  Today's visit was # 5 out of 22.  Starting weight: 279 lbs Starting date: 03/25/17 Today's weight  : 272 lbs Today's date: 05/21/2017 Total lbs lost to date: 7 (Patients must lose 7 lbs  in the first 6 months to continue with counseling)   ASK: We discussed the diagnosis of obesity with Grace Graves today and Grace Graves agreed to give [place] permission to discuss obesity behavioral modification therapy today.  ASSESS: Kaira has the diagnosis of obesity and her BMI today is 41.37 Angeleena is in the action stage of change   ADVISE: Christabelle was educated on the multiple health risks of obesity as well as the benefit of weight loss to improve her health. She was advised of the need for long term treatment and the importance of lifestyle modifications.  AGREE: Multiple dietary modification options and treatment options were discussed and  Shaleen agreed to the above obesity treatment plan.  I, Trixie Dredge, am acting as transcriptionist for Ilene Qua, MD  I have reviewed the above documentation for accuracy and completeness, and I agree with the above. - Ilene Qua, MD

## 2017-05-27 MED FILL — SYNTHROID 112 MCG TABLET: 112 | 30 days supply | Qty: 30 | Fill #7

## 2017-06-08 ENCOUNTER — Ambulatory Visit (INDEPENDENT_AMBULATORY_CARE_PROVIDER_SITE_OTHER): Payer: 59 | Admitting: Family Medicine

## 2017-06-08 VITALS — BP 125/84 | HR 73 | Temp 97.7°F | Ht 68.0 in | Wt 275.0 lb

## 2017-06-08 DIAGNOSIS — R7303 Prediabetes: Secondary | ICD-10-CM

## 2017-06-08 DIAGNOSIS — E559 Vitamin D deficiency, unspecified: Secondary | ICD-10-CM

## 2017-06-08 DIAGNOSIS — Z9189 Other specified personal risk factors, not elsewhere classified: Secondary | ICD-10-CM | POA: Diagnosis not present

## 2017-06-08 DIAGNOSIS — Z6841 Body Mass Index (BMI) 40.0 and over, adult: Secondary | ICD-10-CM | POA: Diagnosis not present

## 2017-06-08 MED ORDER — METFORMIN HCL 500 MG PO TABS: 500.0000 mg | ORAL_TABLET | Freq: Every day | ORAL | 0 refills | Status: DC

## 2017-06-08 MED ORDER — VITAMIN D (ERGOCALCIFEROL) 1.25 MG (50000 UNIT) PO CAPS: 50000.0000 [IU] | ORAL_CAPSULE | ORAL | 0 refills | Status: DC

## 2017-06-08 MED FILL — VIT D2 1.25 MG (50,000 UNIT: 1.25 MG | 28 days supply | Qty: 4 | Fill #0

## 2017-06-08 MED FILL — metFORMIN HCL 500 MG TABS: 500 | 30 days supply | Qty: 30 | Fill #0

## 2017-06-08 NOTE — Progress Notes (Signed)
Office: [phone]  /  Fax: [phone]   HPI:   Chief Complaint: OBESITY Grace Graves is here to discuss her progress with her obesity treatment plan. She is on the Category 3 plan + 200 calories and is following her eating plan approximately 50 % of the time. She states she is walking and at the gym for 15-60 minutes 6 times per week. Grace Graves did struggle with planning past couple of weeks. Often missing dinner and lunch secondary to rushing in the morning. definitely getting water.  Her weight is 275 lb (124.7 kg) today and has gained 3 pounds since her last visit. She has lost 4 lbs since starting treatment with [place].  Vitamin D Deficiency Grace Graves has a diagnosis of vitamin D deficiency. She is currently taking prescription Vit D and denies nausea, vomiting or muscle weakness.  Pre-Diabetes Grace Graves has a diagnosis of pre-diabetes based on her elevated Hgb A1c and was informed this puts her at greater risk of developing diabetes. She is taking metformin currently and continues to work on diet and exercise to decrease risk of diabetes. She denies feelings of hypoglycemia and struggling with meal planning.  At risk for diabetes Grace Graves is at higher than average risk for developing diabetes due to her obesity and pre-diabetes. She currently denies polyuria or polydipsia.  ALLERGIES: No Known Allergies  MEDICATIONS: Current Outpatient Medications on File Prior to Visit  Medication Sig Dispense Refill  . SYNTHROID 112 MCG tablet   2   No current facility-administered medications on file prior to visit.     PAST MEDICAL HISTORY: Past Medical History:  Diagnosis Date  . Abnormal Pap smear    ASCUS-H +HPV 11/11, 8/12  ascus, 5/13 ASCUS HPV-  . AMA (advanced maternal age) multigravida 17+   . Colon polyps   . Complication of anesthesia 12 yrs ago   woke up in middle of anesthesia for eye surgery  . Depression    pp depression x2  - no problems at present  . Depression,  postpartum   . FHx: colon cancer   . Gallstones    3 or 4 gallbladder attacks - with abdominal pain, nausea and vomiting and reflux  . GERD (gastroesophageal reflux disease)   . H/O varicella   . Hypothyroidism   . 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  . Obese   . Vaginal delivery 2003, 2008, 2014    PAST SURGICAL HISTORY: Past Surgical History:  Procedure Laterality Date  . CHOLECYSTECTOMY N/A 04/18/2013   Procedure: LAPAROSCOPIC CHOLECYSTECTOMY WITH INTRAOPERATIVE CHOLANGIOGRAM;  Surgeon: Odis Hollingshead, MD;  Location: [facility];  Service: General;  Laterality: N/A;  . COLONOSCOPY WITH PROPOFOL N/A 05/02/2015   Procedure: COLONOSCOPY WITH PROPOFOL;  Surgeon: Arta Silence, MD;  Location: [facility];  Service: Endoscopy;  Laterality: N/A;  . COLPOSCOPY     2/12,8/12 HPV  changes only  . CYSTO N/A 05/21/2015   Procedure: CYSTO;  Surgeon: Megan Salon, MD;  Location: [place] [facility];  Service: Gynecology;  Laterality: N/A;  . ESOPHAGOGASTRODUODENOSCOPY (EGD) WITH PROPOFOL N/A 05/02/2015   Procedure: ESOPHAGOGASTRODUODENOSCOPY (EGD) WITH PROPOFOL;  Surgeon: Arta Silence, MD;  [facility];  Service: Endoscopy;  Laterality: N/A;  . EYE SURGERY     L eye  . LAPAROSCOPIC BILATERAL SALPINGO OOPHERECTOMY Bilateral 05/21/2015   Procedure: LAPAROSCOPIC BILATERAL SALPINGO OOPHORECTOMY;  Surgeon: Megan Salon, MD;  Location: [place] [facility];  Service: Gynecology;  Laterality: Bilateral;  . LAPAROSCOPIC HYSTERECTOMY N/A  05/21/2015   Procedure: HYSTERECTOMY TOTAL LAPAROSCOPIC;  Surgeon: Megan Salon, MD;  Location: [place] [facility];  Service: Gynecology;  Laterality: N/A;  . LAPAROSCOPY     for ovarian cyst  . MOUTH SURGERY    . TONSILLECTOMY      SOCIAL HISTORY: Social History   Tobacco Use  . Smoking status: Never Smoker  . Smokeless tobacco: Never Used  Substance Use Topics  . Alcohol use: No    Alcohol/week: 0.0 oz  . Drug use: No    FAMILY  HISTORY: Family History  Problem Relation Age of Onset  . Arthritis Mother   . COPD Mother   . Cancer Mother        colon and lung  . Depression Mother   . Diabetes Mother   . Heart disease Mother   . Hyperlipidemia Mother   . Hypertension Mother   . Hypertension Father   . Depression Father   . Depression Sister   . Kidney disease Sister   . Hypothyroidism Sister   . Other Sister        enlarged heart  . Fibromyalgia Sister   . Heart attack Sister   . Colon cancer Brother   . Cancer Maternal Aunt        NOS  . Melanoma Paternal Uncle   . SIDS Brother   . Lung cancer Maternal Aunt     ROS: Review of Systems  Constitutional: Negative for weight loss.  Gastrointestinal: Negative for nausea and vomiting.  Genitourinary: Negative for frequency.  Musculoskeletal:       Negative muscle weakness  Endo/Heme/Allergies: Negative for polydipsia.       Negative hypoglycemia    PHYSICAL EXAM: Blood pressure 125/84, pulse 73, temperature 97.7 F (36.5 C), temperature source Oral, height '5\' 8"'$  (1.727 m), weight 275 lb (124.7 kg), last menstrual period 04/09/2015, SpO2 97 %. Body mass index is 41.81 kg/m. Physical Exam  Constitutional: She is oriented to person, place, and time. She appears well-developed and well-nourished.  Cardiovascular: Normal rate.  Pulmonary/Chest: Effort normal.  Musculoskeletal: Normal range of motion.  Neurological: She is oriented to person, place, and time.  Skin: Skin is warm and dry.  Psychiatric: She has a normal mood and affect. Her behavior is normal.  Vitals reviewed.   RECENT LABS AND TESTS: BMET    Component Value Date/Time   NA 140 03/25/2017 1011   K 4.2 03/25/2017 1011   CL 104 03/25/2017 1011   CO2 22 03/25/2017 1011   GLUCOSE 89 03/25/2017 1011   GLUCOSE 92 04/12/2013 1430   BUN 11 03/25/2017 1011   CREATININE 0.77 03/25/2017 1011   CALCIUM 9.5 03/25/2017 1011   GFRNONAA 93 03/25/2017 1011   GFRAA 107 03/25/2017 1011    Lab Results  Component Value Date   HGBA1C 5.7 (H) 03/25/2017   HGBA1C 6.1 (H) 04/13/2014   Lab Results  Component Value Date   INSULIN 19.9 03/25/2017   CBC    Component Value Date/Time   WBC 5.9 03/25/2017 1011   WBC 9.3 05/22/2015 0548   RBC 4.78 03/25/2017 1011   RBC 3.95 05/22/2015 0548   HGB 12.7 03/25/2017 1011   HCT 39.3 03/25/2017 1011   PLT 293 05/22/2015 0548   MCV 82 03/25/2017 1011   MCH 26.6 03/25/2017 1011   MCH 27.1 05/22/2015 0548   MCHC 32.3 03/25/2017 1011   MCHC 32.6 05/22/2015 0548   RDW 15.3 03/25/2017 1011   LYMPHSABS 1.6 03/25/2017 1011  EOSABS 0.2 03/25/2017 1011   BASOSABS 0.0 03/25/2017 1011   Iron/TIBC/Ferritin/ %Sat No results found for: IRON, TIBC, FERRITIN, IRONPCTSAT Lipid Panel     Component Value Date/Time   CHOL 180 03/25/2017 1011   TRIG 85 03/25/2017 1011   HDL 46 03/25/2017 1011   CHOLHDL 4.7 04/13/2014 1053   VLDL 27 04/13/2014 1053   LDLCALC 117 (H) 03/25/2017 1011   Hepatic Function Panel     Component Value Date/Time   PROT 7.5 03/25/2017 1011   ALBUMIN 4.3 03/25/2017 1011   AST 20 03/25/2017 1011   ALT 25 03/25/2017 1011   ALKPHOS 98 03/25/2017 1011   BILITOT 0.3 03/25/2017 1011      Component Value Date/Time   TSH 1.040 03/25/2017 1011   TSH 6.890 (H) 04/13/2014 1053  Results for RINNAH, PEPPEL (MRN [id number]) as of 06/08/2017 12:03  Ref. Range 03/25/2017 10:11  Vitamin D, 25-Hydroxy Latest Ref Range: 30.0 - 100.0 ng/mL 26.0 (L)    ASSESSMENT AND PLAN: At risk for diabetes mellitus  Vitamin D deficiency - Plan: Vitamin D, Ergocalciferol, (DRISDOL) 50000 units CAPS capsule  Prediabetes - Plan: metFORMIN (GLUCOPHAGE) 500 MG tablet  Class 3 severe obesity with serious comorbidity and body mass index (BMI) of 40.0 to 44.9 in adult, unspecified obesity type ([place])  PLAN:  Vitamin D Deficiency Cyrilla was informed that low vitamin D levels contributes to fatigue and are associated with obesity,  breast, and colon cancer. Brindle agrees to continue taking prescription Vit D '@50'$ ,000 IU every week #4 and we will refill for 1 month. She will follow up for routine testing of vitamin D, at least 2-3 times per year. She was informed of the risk of over-replacement of vitamin D and agrees to not increase her dose unless she discusses this with [place] first. Paris agrees to follow up with our clinic in 2 weeks.  Pre-Diabetes Rozelle will continue to work on weight loss, exercise, and decreasing simple carbohydrates in her diet to help decrease the risk of diabetes. We dicussed metformin including benefits and risks. She was informed that eating too many simple carbohydrates or too many calories at one sitting increases the likelihood of GI side effects. Talya agrees to continue taking metformin 500 mg PO q AM #30 and we will refill for 1 month. Markesia agrees to follow up with our clinic in 2 weeks as directed to monitor her progress.  Diabetes risk counselling Zunairah was given extended (15 minutes) diabetes prevention counseling today. She is 47 y.o. female and has risk factors for diabetes including obesity and pre-diabetes. We discussed intensive lifestyle modifications today with an emphasis on weight loss as well as increasing exercise and decreasing simple carbohydrates in her diet.  Obesity Takima is currently in the action stage of change. As such, her goal is to continue with weight loss efforts She has agreed to follow the Category 3 plan Charniece has been instructed to work up to a goal of 150 minutes of combined cardio and strengthening exercise per week for weight loss and overall health benefits. We discussed the following Behavioral Modification Strategies today: increasing lean protein intake, work on meal planning and easy cooking plans, no skipping meals, and planning for success Planning for water aerobics this week.  Lucrecia has agreed to follow up with our  clinic in 2 weeks. She was informed of the importance of frequent follow up visits to maximize her success with intensive lifestyle modifications for her multiple health conditions.   OBESITY  BEHAVIORAL INTERVENTION VISIT  Today's visit was # 6 out of 22.  Starting weight: 279 lbs Starting date: 03/25/17 Today's weight : 275 lbs Today's date: 06/08/2017 Total lbs lost to date: 4 (Patients must lose 7 lbs in the first 6 months to continue with counseling)   ASK: We discussed the diagnosis of obesity with Priscella Mann today and Salvatrice agreed to give [place] permission to discuss obesity behavioral modification therapy today.  ASSESS: Briaunna has the diagnosis of obesity and her BMI today is 41.82 Leafy is in the action stage of change   ADVISE: Minnie was educated on the multiple health risks of obesity as well as the benefit of weight loss to improve her health. She was advised of the need for long term treatment and the importance of lifestyle modifications.  AGREE: Multiple dietary modification options and treatment options were discussed and  Sariyah agreed to the above obesity treatment plan.  I, Trixie Dredge, am acting as transcriptionist for Ilene Qua, MD  I have reviewed the above documentation for accuracy and completeness, and I agree with the above. - Ilene Qua, MD

## 2017-06-22 ENCOUNTER — Ambulatory Visit (INDEPENDENT_AMBULATORY_CARE_PROVIDER_SITE_OTHER): Payer: 59 | Admitting: Family Medicine

## 2017-06-22 VITALS — BP 131/83 | HR 74 | Temp 97.5°F | Ht 68.0 in | Wt 276.0 lb

## 2017-06-22 DIAGNOSIS — Z6841 Body Mass Index (BMI) 40.0 and over, adult: Secondary | ICD-10-CM

## 2017-06-22 DIAGNOSIS — E038 Other specified hypothyroidism: Secondary | ICD-10-CM

## 2017-06-22 DIAGNOSIS — E559 Vitamin D deficiency, unspecified: Secondary | ICD-10-CM | POA: Diagnosis not present

## 2017-06-23 NOTE — Progress Notes (Signed)
Office: [phone]  /  Fax: [phone]   HPI:   Chief Complaint: OBESITY Grace Graves is here to discuss her progress with her obesity treatment plan. She is on the Category 3 plan and is following her eating plan approximately 10 % of the time. She states she is exercising 0 minutes 0 times per week. Kalaysia worked lots of hours last week so she struggled with eating all food. Trying to take water aerobics.  Her weight is 276 lb (125.2 kg) today and has gained 1 pound since her last visit. She has lost 3 lbs since starting treatment with [place].  Hypothyroidism Grace Graves has a diagnosis of hypothyroidism. She is on Synthroid. She denies hot or cold intolerance or palpitations  Vitamin D Deficiency Grace Graves has a diagnosis of vitamin D deficiency. She is currently taking prescription Vit D. She notes improving fatigue and denies nausea, vomiting or muscle weakness.  ALLERGIES: No Known Allergies  MEDICATIONS: Current Outpatient Medications on File Prior to Visit  Medication Sig Dispense Refill  . metFORMIN (GLUCOPHAGE) 500 MG tablet Take 1 tablet (500 mg total) by mouth daily with breakfast. 30 tablet 0  . SYNTHROID 112 MCG tablet   2  . Vitamin D, Ergocalciferol, (DRISDOL) 50000 units CAPS capsule Take 1 capsule (50,000 Units total) by mouth every 7 (seven) days. 4 capsule 0   No current facility-administered medications on file prior to visit.     PAST MEDICAL HISTORY: Past Medical History:  Diagnosis Date  . Abnormal Pap smear    ASCUS-H +HPV 11/11, 8/12  ascus, 5/13 ASCUS HPV-  . AMA (advanced maternal age) multigravida 12+   . Colon polyps   . Complication of anesthesia 12 yrs ago   woke up in middle of anesthesia for eye surgery  . Depression    pp depression x2  - no problems at present  . Depression, postpartum   . FHx: colon cancer   . Gallstones    3 or 4 gallbladder attacks - with abdominal pain, nausea and vomiting and reflux  . GERD (gastroesophageal  reflux disease)   . H/O varicella   . Hypothyroidism   . 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  . Obese   . Vaginal delivery 2003, 2008, 2014    PAST SURGICAL HISTORY: Past Surgical History:  Procedure Laterality Date  . CHOLECYSTECTOMY N/A 04/18/2013   Procedure: LAPAROSCOPIC CHOLECYSTECTOMY WITH INTRAOPERATIVE CHOLANGIOGRAM;  Surgeon: Odis Hollingshead, MD;  [facility]: [facility];  Service: General;  Laterality: N/A;  . COLONOSCOPY WITH PROPOFOL N/A 05/02/2015   Procedure: COLONOSCOPY WITH PROPOFOL;  Surgeon: Arta Silence, MD;  [facility];  Service: Endoscopy;  Laterality: N/A;  . COLPOSCOPY     2/12,8/12 HPV  changes only  . CYSTO N/A 05/21/2015   Procedure: CYSTO;  Surgeon: Megan Salon, MD;  Location: [place] [facility];  Service: Gynecology;  Laterality: N/A;  . ESOPHAGOGASTRODUODENOSCOPY (EGD) WITH PROPOFOL N/A 05/02/2015   Procedure: ESOPHAGOGASTRODUODENOSCOPY (EGD) WITH PROPOFOL;  Surgeon: Arta Silence, MD;  [facility];  Service: Endoscopy;  Laterality: N/A;  . EYE SURGERY     L eye  . LAPAROSCOPIC BILATERAL SALPINGO OOPHERECTOMY Bilateral 05/21/2015   Procedure: LAPAROSCOPIC BILATERAL SALPINGO OOPHORECTOMY;  Surgeon: Megan Salon, MD;  Location: [place] [facility];  Service: Gynecology;  Laterality: Bilateral;  . LAPAROSCOPIC HYSTERECTOMY N/A 05/21/2015   Procedure: HYSTERECTOMY TOTAL LAPAROSCOPIC;  Surgeon: Megan Salon, MD;  Location: [facility];  Service: Gynecology;  Laterality: N/A;  .  LAPAROSCOPY     for ovarian cyst  . MOUTH SURGERY    . TONSILLECTOMY      SOCIAL HISTORY: Social History   Tobacco Use  . Smoking status: Never Smoker  . Smokeless tobacco: Never Used  Substance Use Topics  . Alcohol use: No    Alcohol/week: 0.0 oz  . Drug use: No    FAMILY HISTORY: Family History  Problem Relation Age of Onset  . Arthritis Mother   . COPD Mother   . Cancer Mother        colon and lung  . Depression Mother   .  Diabetes Mother   . Heart disease Mother   . Hyperlipidemia Mother   . Hypertension Mother   . Hypertension Father   . Depression Father   . Depression Sister   . Kidney disease Sister   . Hypothyroidism Sister   . Other Sister        enlarged heart  . Fibromyalgia Sister   . Heart attack Sister   . Colon cancer Brother   . Cancer Maternal Aunt        NOS  . Melanoma Paternal Uncle   . SIDS Brother   . Lung cancer Maternal Aunt     ROS: Review of Systems  Constitutional: Positive for malaise/fatigue. Negative for weight loss.  Cardiovascular: Negative for palpitations.  Gastrointestinal: Negative for nausea and vomiting.  Musculoskeletal:       Negative muscle weakness  Endo/Heme/Allergies:       Negative hot/cold intolerance    PHYSICAL EXAM: Blood pressure 131/83, pulse 74, temperature (!) 97.5 F (36.4 C), temperature source Oral, height '5\' 8"'$  (1.727 m), weight 276 lb (125.2 kg), last menstrual period 04/09/2015, SpO2 97 %. Body mass index is 41.97 kg/m. Physical Exam  Constitutional: She is oriented to person, place, and time. She appears well-developed and well-nourished.  Cardiovascular: Normal rate.  Pulmonary/Chest: Effort normal.  Musculoskeletal: Normal range of motion.  Neurological: She is oriented to person, place, and time.  Skin: Skin is warm and dry.  Psychiatric: She has a normal mood and affect. Her behavior is normal.  Vitals reviewed.   RECENT LABS AND TESTS: BMET    Component Value Date/Time   NA 140 03/25/2017 1011   K 4.2 03/25/2017 1011   CL 104 03/25/2017 1011   CO2 22 03/25/2017 1011   GLUCOSE 89 03/25/2017 1011   GLUCOSE 92 04/12/2013 1430   BUN 11 03/25/2017 1011   CREATININE 0.77 03/25/2017 1011   CALCIUM 9.5 03/25/2017 1011   GFRNONAA 93 03/25/2017 1011   GFRAA 107 03/25/2017 1011   Lab Results  Component Value Date   HGBA1C 5.7 (H) 03/25/2017   HGBA1C 6.1 (H) 04/13/2014   Lab Results  Component Value Date   INSULIN  19.9 03/25/2017   CBC    Component Value Date/Time   WBC 5.9 03/25/2017 1011   WBC 9.3 05/22/2015 0548   RBC 4.78 03/25/2017 1011   RBC 3.95 05/22/2015 0548   HGB 12.7 03/25/2017 1011   HCT 39.3 03/25/2017 1011   PLT 293 05/22/2015 0548   MCV 82 03/25/2017 1011   MCH 26.6 03/25/2017 1011   MCH 27.1 05/22/2015 0548   MCHC 32.3 03/25/2017 1011   MCHC 32.6 05/22/2015 0548   RDW 15.3 03/25/2017 1011   LYMPHSABS 1.6 03/25/2017 1011   EOSABS 0.2 03/25/2017 1011   BASOSABS 0.0 03/25/2017 1011   Iron/TIBC/Ferritin/ %Sat No results found for: IRON, TIBC, FERRITIN, IRONPCTSAT Lipid  Panel     Component Value Date/Time   CHOL 180 03/25/2017 1011   TRIG 85 03/25/2017 1011   HDL 46 03/25/2017 1011   CHOLHDL 4.7 04/13/2014 1053   VLDL 27 04/13/2014 1053   LDLCALC 117 (H) 03/25/2017 1011   Hepatic Function Panel     Component Value Date/Time   PROT 7.5 03/25/2017 1011   ALBUMIN 4.3 03/25/2017 1011   AST 20 03/25/2017 1011   ALT 25 03/25/2017 1011   ALKPHOS 98 03/25/2017 1011   BILITOT 0.3 03/25/2017 1011      Component Value Date/Time   TSH 1.040 03/25/2017 1011   TSH 6.890 (H) 04/13/2014 1053  Results for LYNESHA, BANGO (MRN [id number]) as of 06/23/2017 12:06  Ref. Range 03/25/2017 10:11  Vitamin D, 25-Hydroxy Latest Ref Range: 30.0 - 100.0 ng/mL 26.0 (L)    ASSESSMENT AND PLAN: Other specified hypothyroidism  Vitamin D deficiency  Class 3 severe obesity with serious comorbidity and body mass index (BMI) of 40.0 to 44.9 in adult, unspecified obesity type ([place])  PLAN:  Hypothyroidism Kendria was informed of the importance of good thyroid control to help with weight loss efforts. She was also informed that supertheraputic thyroid levels are dangerous and will not improve weight loss results. Ameisha agrees to continue taking Synthroid 112 mcg PO daily and she agrees to follow up with our clinic in 2 weeks.  Vitamin D Deficiency Teyanna was informed that low  vitamin D levels contributes to fatigue and are associated with obesity, breast, and colon cancer. Cumi agrees to continue taking prescription Vit D '@50'$ ,000 IU every week and will follow up for routine testing of vitamin D, at least 2-3 times per year. She was informed of the risk of over-replacement of vitamin D and agrees to not increase her dose unless she discusses this with [place] first. Tassie agrees to follow up with our clinic in 2 weeks.  We spent > than 50% of the 15 minute visit on the counseling as documented in the note.  Obesity Kinlee is currently in the action stage of change. As such, her goal is to continue with weight loss efforts She has agreed to keep a food journal with 450-600 calories and 40 grams of protein at supper daily and follow the Category 3 plan Noble has been instructed to work up to a goal of 150 minutes of combined cardio and strengthening exercise per week for weight loss and overall health benefits. We discussed the following Behavioral Modification Strategies today: increasing lean protein intake, work on meal planning and easy cooking plans, no skipping meals, planning for success, and keeping a strict food journal   Elesa has agreed to follow up with our clinic in 2 weeks. She was informed of the importance of frequent follow up visits to maximize her success with intensive lifestyle modifications for her multiple health conditions.   OBESITY BEHAVIORAL INTERVENTION VISIT  Today's visit was # 7 out of 22.  Starting weight: 279 lbs Starting date: 03/25/17 Today's weight : 276 lbs  Today's date: 06/22/2017 Total lbs lost to date: 3 (Patients must lose 7 lbs in the first 6 months to continue with counseling)   ASK: We discussed the diagnosis of obesity with Priscella Mann today and Verba agreed to give [place] permission to discuss obesity behavioral modification therapy today.  ASSESS: Keashia has the diagnosis of obesity and her  BMI today is 41.98 Veera is in the action stage of change   ADVISE: Ayden was  educated on the multiple health risks of obesity as well as the benefit of weight loss to improve her health. She was advised of the need for long term treatment and the importance of lifestyle modifications.  AGREE: Multiple dietary modification options and treatment options were discussed and  Opal agreed to the above obesity treatment plan.  I, Trixie Dredge, am acting as transcriptionist for Ilene Qua, MD I have reviewed the above documentation for accuracy and completeness, and I agree with the above. - Ilene Qua, MD

## 2017-06-29 MED FILL — SYNTHROID 112 MCG TABLET: 112 | 30 days supply | Qty: 30 | Fill #8

## 2017-07-06 ENCOUNTER — Ambulatory Visit (INDEPENDENT_AMBULATORY_CARE_PROVIDER_SITE_OTHER): Payer: 59 | Admitting: Family Medicine

## 2017-07-06 VITALS — BP 126/82 | HR 82 | Temp 97.8°F | Ht 68.0 in | Wt 274.0 lb

## 2017-07-06 DIAGNOSIS — R7303 Prediabetes: Secondary | ICD-10-CM | POA: Diagnosis not present

## 2017-07-06 DIAGNOSIS — E559 Vitamin D deficiency, unspecified: Secondary | ICD-10-CM | POA: Diagnosis not present

## 2017-07-06 DIAGNOSIS — Z6841 Body Mass Index (BMI) 40.0 and over, adult: Secondary | ICD-10-CM

## 2017-07-06 DIAGNOSIS — Z9189 Other specified personal risk factors, not elsewhere classified: Secondary | ICD-10-CM

## 2017-07-06 DIAGNOSIS — E66813 Obesity, class 3: Secondary | ICD-10-CM

## 2017-07-06 MED ORDER — METFORMIN HCL 500 MG PO TABS: 500.0000 mg | ORAL_TABLET | Freq: Every day | ORAL | 0 refills | Status: AC

## 2017-07-06 MED ORDER — VITAMIN D (ERGOCALCIFEROL) 1.25 MG (50000 UNIT) PO CAPS: 50000.0000 [IU] | ORAL_CAPSULE | ORAL | 0 refills | Status: AC

## 2017-07-06 MED FILL — metFORMIN HCL 500 MG TABS: 500 | 30 days supply | Qty: 30 | Fill #0

## 2017-07-06 MED FILL — VIT D2 1.25 MG (50,000 UNIT: 1.25 MG | 28 days supply | Qty: 4 | Fill #0

## 2017-07-06 NOTE — Progress Notes (Signed)
Office: [phone]  /  Fax: [phone]   HPI:   Chief Complaint: OBESITY Grace Graves is here to discuss her progress with her obesity treatment plan. She is on the keep a food journal with 450-600 calories and 40 grams of protein at supper daily and follow the Category 3 plan and is following her eating plan approximately 20 % of the time. She states she is doing water aerobics for 60 minutes 2 times per week. Grace Graves has been working lots of overtime. Has been eating frequent cafeteria food at work secondary to fatigue, lack of packing lunch and convenience.  Her weight is 274 lb (124.3 kg) today and has had a weight loss of 2 pounds over a period of 2 weeks since her last visit. She has lost 5 lbs since starting treatment with [place].  Vitamin D Deficiency Grace Graves has a diagnosis of vitamin D deficiency. She is currently taking prescription Vit D. She notes fatigue and denies nausea, vomiting or muscle weakness.  Pre-Diabetes Grace Graves has a diagnosis of pre-diabetes based on her elevated Hgb A1c and was informed this puts her at greater risk of developing diabetes. She is taking metformin currently and continues to work on diet and exercise to decrease risk of diabetes. She notes occasional carbohydrate cravings. She denies nausea or hypoglycemia.  At risk for diabetes Grace Graves is at higher than average risk for developing diabetes due to her obesity and pre-diabetes. She currently denies polyuria or polydipsia.  ALLERGIES: No Known Allergies  MEDICATIONS: Current Outpatient Medications on File Prior to Visit  Medication Sig Dispense Refill  . metFORMIN (GLUCOPHAGE) 500 MG tablet Take 1 tablet (500 mg total) by mouth daily with breakfast. 30 tablet 0  . SYNTHROID 112 MCG tablet   2  . Vitamin D, Ergocalciferol, (DRISDOL) 50000 units CAPS capsule Take 1 capsule (50,000 Units total) by mouth every 7 (seven) days. 4 capsule 0   No current facility-administered medications on file  prior to visit.     PAST MEDICAL HISTORY: Past Medical History:  Diagnosis Date  . Abnormal Pap smear    ASCUS-H +HPV 11/11, 8/12  [place], 5/13 ASCUS HPV-  . AMA (advanced maternal age) multigravida 68+   . Colon polyps   . Complication of anesthesia 12 yrs ago   woke up in middle of anesthesia for eye surgery  . Depression    pp depression x2  - no problems at present  . Depression, postpartum   . FHx: colon cancer   . Gallstones    3 or 4 gallbladder attacks - with abdominal pain, nausea and vomiting and reflux  . GERD (gastroesophageal reflux disease)   . H/O varicella   . Hypothyroidism   . 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  . Obese   . Vaginal delivery 2003, 2008, 2014    PAST SURGICAL HISTORY: Past Surgical History:  Procedure Laterality Date  . CHOLECYSTECTOMY N/A 04/18/2013   Procedure: LAPAROSCOPIC CHOLECYSTECTOMY WITH INTRAOPERATIVE CHOLANGIOGRAM;  Surgeon: Odis Hollingshead, MD;  Location: [facility];  Service: General;  Laterality: N/A;  . COLONOSCOPY WITH PROPOFOL N/A 05/02/2015   Procedure: COLONOSCOPY WITH PROPOFOL;  Surgeon: Arta Silence, MD;  [facility];  Service: Endoscopy;  Laterality: N/A;  . COLPOSCOPY     2/12,8/12 HPV  changes only  . CYSTO N/A 05/21/2015   Procedure: CYSTO;  Surgeon: Megan Salon, MD;  Location: [place] [facility];  Service: Gynecology;  Laterality: N/A;  . ESOPHAGOGASTRODUODENOSCOPY (  EGD) WITH PROPOFOL N/A 05/02/2015   Procedure: ESOPHAGOGASTRODUODENOSCOPY (EGD) WITH PROPOFOL;  Surgeon: Arta Silence, MD;  [facility];  Service: Endoscopy;  Laterality: N/A;  . EYE SURGERY     L eye  . LAPAROSCOPIC BILATERAL SALPINGO OOPHERECTOMY Bilateral 05/21/2015   Procedure: LAPAROSCOPIC BILATERAL SALPINGO OOPHORECTOMY;  Surgeon: Megan Salon, MD;  Location: [facility];  Service: Gynecology;  Laterality: Bilateral;  . LAPAROSCOPIC HYSTERECTOMY N/A 05/21/2015   Procedure: HYSTERECTOMY TOTAL  LAPAROSCOPIC;  Surgeon: Megan Salon, MD;  Location: [facility];  Service: Gynecology;  Laterality: N/A;  . LAPAROSCOPY     for ovarian cyst  . MOUTH SURGERY    . TONSILLECTOMY      SOCIAL HISTORY: Social History   Tobacco Use  . Smoking status: Never Smoker  . Smokeless tobacco: Never Used  Substance Use Topics  . Alcohol use: No    Alcohol/week: 0.0 oz  . Drug use: No    FAMILY HISTORY: Family History  Problem Relation Age of Onset  . Arthritis Mother   . COPD Mother   . Cancer Mother        colon and lung  . Depression Mother   . Diabetes Mother   . Heart disease Mother   . Hyperlipidemia Mother   . Hypertension Mother   . Hypertension Father   . Depression Father   . Depression Sister   . Kidney disease Sister   . Hypothyroidism Sister   . Other Sister        enlarged heart  . Fibromyalgia Sister   . Heart attack Sister   . Colon cancer Brother   . Cancer Maternal Aunt        NOS  . Melanoma Paternal Uncle   . SIDS Brother   . Lung cancer Maternal Aunt     ROS: Review of Systems  Constitutional: Positive for malaise/fatigue and weight loss.  Gastrointestinal: Negative for nausea and vomiting.  Genitourinary: Negative for frequency.  Musculoskeletal:       Negative muscle weakness  Endo/Heme/Allergies: Negative for polydipsia.       Negative hypoglycemia    PHYSICAL EXAM: Blood pressure 126/82, pulse 82, temperature 97.8 F (36.6 C), temperature source Oral, height '5\' 8"'$  (1.727 m), weight 274 lb (124.3 kg), last menstrual period 04/09/2015, SpO2 100 %. Body mass index is 41.66 kg/m. Physical Exam  Constitutional: She is oriented to person, place, and time. She appears well-developed and well-nourished.  Cardiovascular: Normal rate.  Pulmonary/Chest: Effort normal.  Musculoskeletal: Normal range of motion.  Neurological: She is oriented to person, place, and time.  Skin: Skin is warm and dry.  Psychiatric: She has a normal mood and affect. Her  behavior is normal.  Vitals reviewed.   RECENT LABS AND TESTS: BMET    Component Value Date/Time   NA 140 03/25/2017 1011   K 4.2 03/25/2017 1011   CL 104 03/25/2017 1011   CO2 22 03/25/2017 1011   GLUCOSE 89 03/25/2017 1011   GLUCOSE 92 04/12/2013 1430   BUN 11 03/25/2017 1011   CREATININE 0.77 03/25/2017 1011   CALCIUM 9.5 03/25/2017 1011   GFRNONAA 93 03/25/2017 1011   GFRAA 107 03/25/2017 1011   Lab Results  Component Value Date   HGBA1C 5.7 (H) 03/25/2017   HGBA1C 6.1 (H) 04/13/2014   Lab Results  Component Value Date   INSULIN 19.9 03/25/2017   CBC    Component Value Date/Time   WBC 5.9 03/25/2017 1011   WBC 9.3 05/22/2015  0548   RBC 4.78 03/25/2017 1011   RBC 3.95 05/22/2015 0548   HGB 12.7 03/25/2017 1011   HCT 39.3 03/25/2017 1011   PLT 293 05/22/2015 0548   MCV 82 03/25/2017 1011   MCH 26.6 03/25/2017 1011   MCH 27.1 05/22/2015 0548   MCHC 32.3 03/25/2017 1011   MCHC 32.6 05/22/2015 0548   RDW 15.3 03/25/2017 1011   LYMPHSABS 1.6 03/25/2017 1011   EOSABS 0.2 03/25/2017 1011   BASOSABS 0.0 03/25/2017 1011   Iron/TIBC/Ferritin/ %Sat No results found for: IRON, TIBC, FERRITIN, IRONPCTSAT Lipid Panel     Component Value Date/Time   CHOL 180 03/25/2017 1011   TRIG 85 03/25/2017 1011   HDL 46 03/25/2017 1011   CHOLHDL 4.7 04/13/2014 1053   VLDL 27 04/13/2014 1053   LDLCALC 117 (H) 03/25/2017 1011   Hepatic Function Panel     Component Value Date/Time   PROT 7.5 03/25/2017 1011   ALBUMIN 4.3 03/25/2017 1011   AST 20 03/25/2017 1011   ALT 25 03/25/2017 1011   ALKPHOS 98 03/25/2017 1011   BILITOT 0.3 03/25/2017 1011      Component Value Date/Time   TSH 1.040 03/25/2017 1011   TSH 6.890 (H) 04/13/2014 1053  Results for KRYSTAN, NORTHROP (MRN [id number]) as of 07/06/2017 15:49  Ref. Range 03/25/2017 10:11  Vitamin D, 25-Hydroxy Latest Ref Range: 30.0 - 100.0 ng/mL 26.0 (L)    ASSESSMENT AND PLAN: Vitamin D deficiency - Plan: VITAMIN D 25  Hydroxy (Vit-D Deficiency, Fractures), Vitamin D, Ergocalciferol, (DRISDOL) 50000 units CAPS capsule  Pre-diabetes - Plan: Hemoglobin A1c, Insulin, random, Lipid Panel With LDL/HDL Ratio  At risk for diabetes mellitus  Class 3 severe obesity with serious comorbidity and body mass index (BMI) of 40.0 to 44.9 in adult, unspecified obesity type (HCC)  Prediabetes - Plan: metFORMIN (GLUCOPHAGE) 500 MG tablet  PLAN:  Vitamin D Deficiency Grace Graves was informed that low vitamin D levels contributes to fatigue and are associated with obesity, breast, and colon cancer. Grace Graves agrees to continue taking prescription Vit D '@50'$ ,000 IU every week #4 and we will refill for 1 month. She will follow up for routine testing of vitamin D, at least 2-3 times per year. She was informed of the risk of over-replacement of vitamin D and agrees to not increase her dose unless she discusses this with [place] first. Grace Graves agrees to follow up with our clinic in 2 to 3 weeks.  Pre-Diabetes Grace Graves will continue to work on weight loss, exercise, and decreasing simple carbohydrates in her diet to help decrease the risk of diabetes. We dicussed metformin including benefits and risks. She was informed that eating too many simple carbohydrates or too many calories at one sitting increases the likelihood of GI side effects. Grace Graves agrees to continue taking metformin 500 mg PO q AM #30 and we will refill for 1 month. Grace Graves agrees to follow up with our clinic in 2 to 3 weeks as directed to monitor her progress.  Diabetes risk counselling Grace Graves was given extended (15 minutes) diabetes prevention counseling today. She is 47 y.o. female and has risk factors for diabetes including obesity and pre-diabetes. We discussed intensive lifestyle modifications today with an emphasis on weight loss as well as increasing exercise and decreasing simple carbohydrates in her diet.  Obesity Grace Graves is currently in the action stage  of change. As such, her goal is to continue with weight loss efforts She has agreed to keep a food journal with 1450-1600 calories  and 90+ grams of protein daily Grace Graves has been instructed to work up to a goal of 150 minutes of combined cardio and strengthening exercise per week for weight loss and overall health benefits. We discussed the following Behavioral Modification Strategies today: increasing lean protein intake, increasing vegetables, work on meal planning and easy cooking plans, better snacking choices, and planning for success   Grace Graves has agreed to follow up with our clinic in 2 to 3 weeks. She was informed of the importance of frequent follow up visits to maximize her success with intensive lifestyle modifications for her multiple health conditions.   OBESITY BEHAVIORAL INTERVENTION VISIT  Today's visit was # 8 out of 22.  Starting weight: 279 lbs Starting date: 03/25/17 Today's weight : 274 lbs  Today's date: 07/06/2017 Total lbs lost to date: 5 (Patients must lose 7 lbs in the first 6 months to continue with counseling)   ASK: We discussed the diagnosis of obesity with Grace Graves today and Grace Graves agreed to give [place] permission to discuss obesity behavioral modification therapy today.  ASSESS: Grace Graves has the diagnosis of obesity and her BMI today is 41.67 Grace Graves is in the action stage of change   ADVISE: Grace Graves was educated on the multiple health risks of obesity as well as the benefit of weight loss to improve her health. She was advised of the need for long term treatment and the importance of lifestyle modifications.  AGREE: Multiple dietary modification options and treatment options were discussed and  Grace Graves agreed to the above obesity treatment plan.  I, Trixie Dredge, am acting as transcriptionist for Ilene Qua, MD  I have reviewed the above documentation for accuracy and completeness, and I agree with the above. - Ilene Qua, MD

## 2017-07-07 LAB — VITAMIN D 25 HYDROXY (VIT D DEFICIENCY, FRACTURES): Vit D, 25-Hydroxy: 33.7 ng/mL (ref 30.0–100.0)

## 2017-07-07 LAB — HEMOGLOBIN A1C
Est. average glucose Bld gHb Est-mCnc: 120 mg/dL
Hgb A1c MFr Bld: 5.8 % — ABNORMAL HIGH (ref 4.8–5.6)

## 2017-07-07 LAB — INSULIN, RANDOM: INSULIN: 21.1 u[IU]/mL (ref 2.6–24.9)

## 2017-07-07 LAB — LIPID PANEL WITH LDL/HDL RATIO
Cholesterol, Total: 186 mg/dL (ref 100–199)
HDL: 49 mg/dL (ref >39)
LDL CALC: 117 mg/dL — AB (ref 0–99)
LDL/HDL RATIO: 2.4 ratio (ref 0.0–3.2)
Triglycerides: 101 mg/dL (ref 0–149)
VLDL CHOLESTEROL CAL: 20 mg/dL (ref 5–40)

## 2017-07-28 MED FILL — SYNTHROID 112 MCG TABLET: 112 | 30 days supply | Qty: 30 | Fill #9

## 2017-07-29 ENCOUNTER — Ambulatory Visit (INDEPENDENT_AMBULATORY_CARE_PROVIDER_SITE_OTHER): Payer: 59 | Admitting: Family Medicine

## 2017-07-29 VITALS — BP 128/80 | HR 66 | Temp 97.9°F | Ht 68.0 in | Wt 274.0 lb

## 2017-07-29 DIAGNOSIS — Z6841 Body Mass Index (BMI) 40.0 and over, adult: Secondary | ICD-10-CM

## 2017-07-29 DIAGNOSIS — R7303 Prediabetes: Secondary | ICD-10-CM

## 2017-07-29 DIAGNOSIS — E559 Vitamin D deficiency, unspecified: Secondary | ICD-10-CM

## 2017-07-29 NOTE — Progress Notes (Signed)
Office: [phone]  /  Fax: [phone]   HPI:   Chief Complaint: OBESITY Grace Graves is here to discuss her progress with her obesity treatment plan. She is on the keep a food journal with 1450-1600 calories and 90+ grams of protein daily and is following her eating plan approximately 40 % of the time. She states she is doing water aerobics for 60 minutes 2 times per week. Grace Graves is working on getting protein in at meals and also doing less carbohydrate rich snacks. Has been doing more sugar dense fruit for snacks.  Her weight is 274 lb (124.3 kg) today and has not lost weight since her last visit. She has lost 5 lbs since starting treatment with [place].  Pre-Diabetes Grace Graves has a diagnosis of pre-diabetes based on her elevated Hgb A1c and was informed this puts her at greater risk of developing diabetes. She denies carbohydrate cravings. She is taking metformin currently and continues to work on diet and exercise to decrease risk of diabetes. She denies nausea or hypoglycemia.  Vitamin D Deficiency Grace Graves has a diagnosis of vitamin D deficiency. She is currently taking prescription Vit D. She notes fatigue and denies nausea, vomiting or muscle weakness.  ALLERGIES: No Known Allergies  MEDICATIONS: Current Outpatient Medications on File Prior to Visit  Medication Sig Dispense Refill  . metFORMIN (GLUCOPHAGE) 500 MG tablet Take 1 tablet (500 mg total) by mouth daily with breakfast. 30 tablet 0  . SYNTHROID 112 MCG tablet   2  . Vitamin D, Ergocalciferol, (DRISDOL) 50000 units CAPS capsule Take 1 capsule (50,000 Units total) by mouth every 7 (seven) days. 4 capsule 0   No current facility-administered medications on file prior to visit.     PAST MEDICAL HISTORY: Past Medical History:  Diagnosis Date  . Abnormal Pap smear    ASCUS-H +HPV 11/11, 8/12  [place], 5/13 ASCUS HPV-  . AMA (advanced maternal age) multigravida 43+   . Colon polyps   . Complication of anesthesia 12  yrs ago   woke up in middle of anesthesia for eye surgery  . Depression    pp depression x2  - no problems at present  . Depression, postpartum   . FHx: colon cancer   . Gallstones    3 or 4 gallbladder attacks - with abdominal pain, nausea and vomiting and reflux  . GERD (gastroesophageal reflux disease)   . H/O varicella   . Hypothyroidism   . 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  . Obese   . Vaginal delivery 2003, 2008, 2014    PAST SURGICAL HISTORY: Past Surgical History:  Procedure Laterality Date  . CHOLECYSTECTOMY N/A 04/18/2013   Procedure: LAPAROSCOPIC CHOLECYSTECTOMY WITH INTRAOPERATIVE CHOLANGIOGRAM;  Surgeon: Odis Hollingshead, MD;  [facility];  Service: General;  Laterality: N/A;  . COLONOSCOPY WITH PROPOFOL N/A 05/02/2015   Procedure: COLONOSCOPY WITH PROPOFOL;  Surgeon: Arta Silence, MD;  [facility];  Service: Endoscopy;  Laterality: N/A;  . COLPOSCOPY     2/12,8/12 HPV  changes only  . CYSTO N/A 05/21/2015   Procedure: CYSTO;  Surgeon: Megan Salon, MD;  Location: [place] [facility];  Service: Gynecology;  Laterality: N/A;  . ESOPHAGOGASTRODUODENOSCOPY (EGD) WITH PROPOFOL N/A 05/02/2015   Procedure: ESOPHAGOGASTRODUODENOSCOPY (EGD) WITH PROPOFOL;  Surgeon: Arta Silence, MD;  [facility];  Service: Endoscopy;  Laterality: N/A;  . EYE SURGERY     L eye  . LAPAROSCOPIC BILATERAL SALPINGO OOPHERECTOMY Bilateral 05/21/2015  Procedure: LAPAROSCOPIC BILATERAL SALPINGO OOPHORECTOMY;  Surgeon: Megan Salon, MD;  Location: [facility];  Service: Gynecology;  Laterality: Bilateral;  . LAPAROSCOPIC HYSTERECTOMY N/A 05/21/2015   Procedure: HYSTERECTOMY TOTAL LAPAROSCOPIC;  Surgeon: Megan Salon, MD;  Location: [place] [facility];  Service: Gynecology;  Laterality: N/A;  . LAPAROSCOPY     for ovarian cyst  . MOUTH SURGERY    . TONSILLECTOMY      SOCIAL HISTORY: Social History   Tobacco Use  . Smoking status: Never  Smoker  . Smokeless tobacco: Never Used  Substance Use Topics  . Alcohol use: No    Alcohol/week: 0.0 oz  . Drug use: No    FAMILY HISTORY: Family History  Problem Relation Age of Onset  . Arthritis Mother   . COPD Mother   . Cancer Mother        colon and lung  . Depression Mother   . Diabetes Mother   . Heart disease Mother   . Hyperlipidemia Mother   . Hypertension Mother   . Hypertension Father   . Depression Father   . Depression Sister   . Kidney disease Sister   . Hypothyroidism Sister   . Other Sister        enlarged heart  . Fibromyalgia Sister   . Heart attack Sister   . Colon cancer Brother   . Cancer Maternal Aunt        NOS  . Melanoma Paternal Uncle   . SIDS Brother   . Lung cancer Maternal Aunt     ROS: Review of Systems  Constitutional: Positive for malaise/fatigue. Negative for weight loss.  Gastrointestinal: Negative for nausea and vomiting.  Musculoskeletal:       Negative muscle weakness  Endo/Heme/Allergies:       Negative hypoglycemia    PHYSICAL EXAM: Blood pressure 128/80, pulse 66, temperature 97.9 F (36.6 C), temperature source Oral, height '5\' 8"'$  (1.727 m), weight 274 lb (124.3 kg), last menstrual period 04/09/2015, SpO2 96 %. Body mass index is 41.66 kg/m. Physical Exam  Constitutional: She is oriented to person, place, and time. She appears well-developed and well-nourished.  Cardiovascular: Normal rate.  Pulmonary/Chest: Effort normal.  Musculoskeletal: Normal range of motion.  Neurological: She is oriented to person, place, and time.  Skin: Skin is warm and dry.  Psychiatric: She has a normal mood and affect. Her behavior is normal.  Vitals reviewed.   RECENT LABS AND TESTS: BMET    Component Value Date/Time   NA 140 03/25/2017 1011   K 4.2 03/25/2017 1011   CL 104 03/25/2017 1011   CO2 22 03/25/2017 1011   GLUCOSE 89 03/25/2017 1011   GLUCOSE 92 04/12/2013 1430   BUN 11 03/25/2017 1011   CREATININE 0.77  03/25/2017 1011   CALCIUM 9.5 03/25/2017 1011   GFRNONAA 93 03/25/2017 1011   GFRAA 107 03/25/2017 1011   Lab Results  Component Value Date   HGBA1C 5.8 (H) 07/06/2017   HGBA1C 5.7 (H) 03/25/2017   HGBA1C 6.1 (H) 04/13/2014   Lab Results  Component Value Date   INSULIN 21.1 07/06/2017   INSULIN 19.9 03/25/2017   CBC    Component Value Date/Time   WBC 5.9 03/25/2017 1011   WBC 9.3 05/22/2015 0548   RBC 4.78 03/25/2017 1011   RBC 3.95 05/22/2015 0548   HGB 12.7 03/25/2017 1011   HCT 39.3 03/25/2017 1011   PLT 293 05/22/2015 0548   MCV 82 03/25/2017 1011   MCH 26.6 03/25/2017 1011  MCH 27.1 05/22/2015 0548   MCHC 32.3 03/25/2017 1011   MCHC 32.6 05/22/2015 0548   RDW 15.3 03/25/2017 1011   LYMPHSABS 1.6 03/25/2017 1011   EOSABS 0.2 03/25/2017 1011   BASOSABS 0.0 03/25/2017 1011   Iron/TIBC/Ferritin/ %Sat No results found for: IRON, TIBC, FERRITIN, IRONPCTSAT Lipid Panel     Component Value Date/Time   CHOL 186 07/06/2017 1030   TRIG 101 07/06/2017 1030   HDL 49 07/06/2017 1030   CHOLHDL 4.7 04/13/2014 1053   VLDL 27 04/13/2014 1053   LDLCALC 117 (H) 07/06/2017 1030   Hepatic Function Panel     Component Value Date/Time   PROT 7.5 03/25/2017 1011   ALBUMIN 4.3 03/25/2017 1011   AST 20 03/25/2017 1011   ALT 25 03/25/2017 1011   ALKPHOS 98 03/25/2017 1011   BILITOT 0.3 03/25/2017 1011      Component Value Date/Time   TSH 1.040 03/25/2017 1011   TSH 6.890 (H) 04/13/2014 1053  Results for CASTELLA, LERNER (MRN [id number]) as of 07/29/2017 10:14  Ref. Range 07/06/2017 10:30  Vitamin D, 25-Hydroxy Latest Ref Range: 30.0 - 100.0 ng/mL 33.7    ASSESSMENT AND PLAN: Prediabetes  Vitamin D deficiency  Class 3 severe obesity with serious comorbidity and body mass index (BMI) of 40.0 to 44.9 in adult, unspecified obesity type [facility])  PLAN:  Pre-Diabetes Grace Graves will continue to work on weight loss, exercise, and decreasing simple carbohydrates in her diet  to help decrease the risk of diabetes. We dicussed metformin including benefits and risks. She was informed that eating too many simple carbohydrates or too many calories at one sitting increases the likelihood of GI side effects. Grace Graves agrees to continue taking metformin 500 mg PO q AM, no refill needed. Grace Graves agrees to follow up with our clinic in 2 weeks as directed to monitor her progress.  Vitamin D Deficiency Grace Graves was informed that low vitamin D levels contributes to fatigue and are associated with obesity, breast, and colon cancer. Grace Graves agrees to continue taking prescription Vit D '@50'$ ,000 IU every week, no refill needed. She will follow up for routine testing of vitamin D, at least 2-3 times per year. She was informed of the risk of over-replacement of vitamin D and agrees to not increase her dose unless she discusses this with [place] first. Grace Graves agrees to follow up with our clinic in 2 weeks.  We spent > than 50% of the 15 minute visit on the counseling as documented in the note.  Obesity Grace Graves is currently in the action stage of change. As such, her goal is to continue with weight loss efforts She has agreed to keep a food journal with 1600 calories and 90+ grams of protein daily Grace Graves has been instructed to work up to a goal of 150 minutes of combined cardio and strengthening exercise per week for weight loss and overall health benefits. We discussed the following Behavioral Modification Strategies today: increasing lean protein intake, increasing vegetables, work on meal planning and easy cooking plans, and planning for success   Grace Graves has agreed to follow up with our clinic in 2 weeks. She was informed of the importance of frequent follow up visits to maximize her success with intensive lifestyle modifications for her multiple health conditions.   OBESITY BEHAVIORAL INTERVENTION VISIT  Today's visit was # 9 out of 22.  Starting weight: 279  lbs Starting date: 03/25/17 Today's weight : 274 lbs  Today's date: 07/29/2017 Total lbs lost to date: 5 (Patients  must lose 7 lbs in the first 6 months to continue with counseling)   ASK: We discussed the diagnosis of obesity with Grace Graves today and Smt. agreed to give [place] permission to discuss obesity behavioral modification therapy today.  ASSESS: Akylah has the diagnosis of obesity and her BMI today is 41.67 Hayleigh is in the action stage of change   ADVISE: Shakeila was educated on the multiple health risks of obesity as well as the benefit of weight loss to improve her health. She was advised of the need for long term treatment and the importance of lifestyle modifications.  AGREE: Multiple dietary modification options and treatment options were discussed and  Marilena agreed to the above obesity treatment plan.  I, Trixie Dredge, am acting as transcriptionist for Ilene Qua, MD  I have reviewed the above documentation for accuracy and completeness, and I agree with the above. - Ilene Qua, MD

## 2017-08-15 ENCOUNTER — Encounter (INDEPENDENT_AMBULATORY_CARE_PROVIDER_SITE_OTHER): Payer: Self-pay | Admitting: Family Medicine

## 2017-08-17 ENCOUNTER — Ambulatory Visit (INDEPENDENT_AMBULATORY_CARE_PROVIDER_SITE_OTHER): Payer: 59 | Admitting: Family Medicine

## 2017-08-17 ENCOUNTER — Encounter (INDEPENDENT_AMBULATORY_CARE_PROVIDER_SITE_OTHER): Payer: Self-pay

## 2017-08-17 NOTE — Telephone Encounter (Signed)
Please cancel

## 2017-08-24 DIAGNOSIS — R7303 Prediabetes: Secondary | ICD-10-CM | POA: Diagnosis not present

## 2017-08-27 IMAGING — MG MM SCREENING BREAST TOMO BILATERAL
8 of 12 series · 8 of 28 positions shown · non-contrast
Comparison: Previous exam(s).

CLINICAL DATA: Screening.

EXAM:
DIGITAL SCREENING BILATERAL MAMMOGRAM WITH 3D TOMO WITH CAD

[L CC]
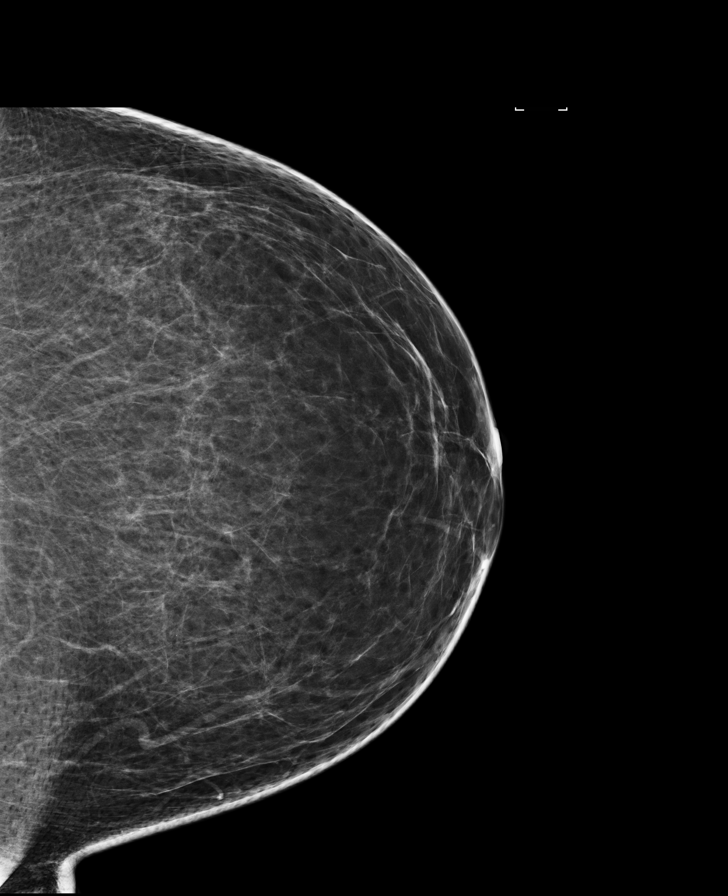

[L CC synth-2D]
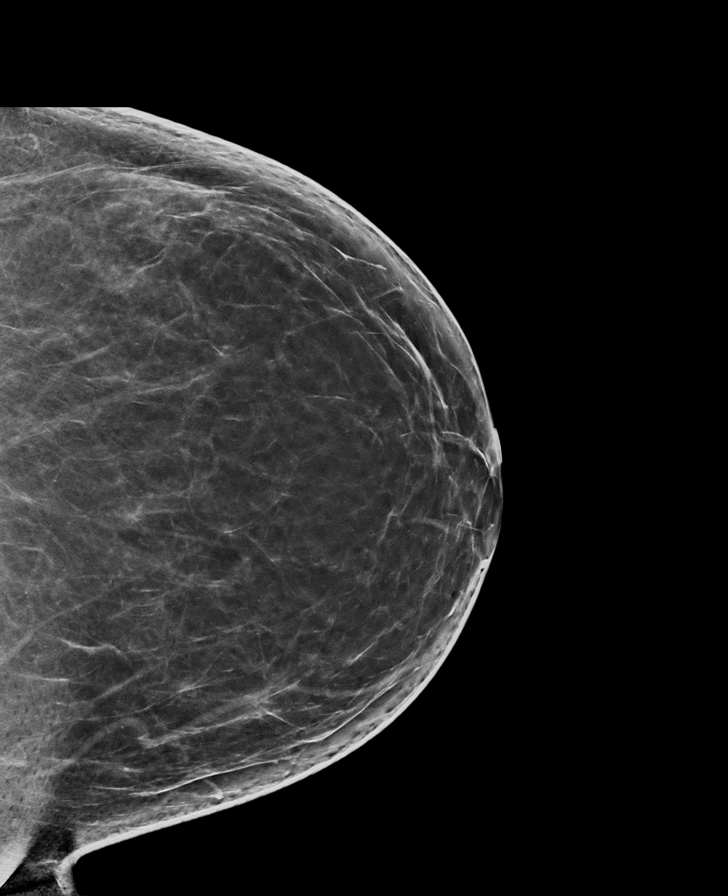

[R CC synth-2D]
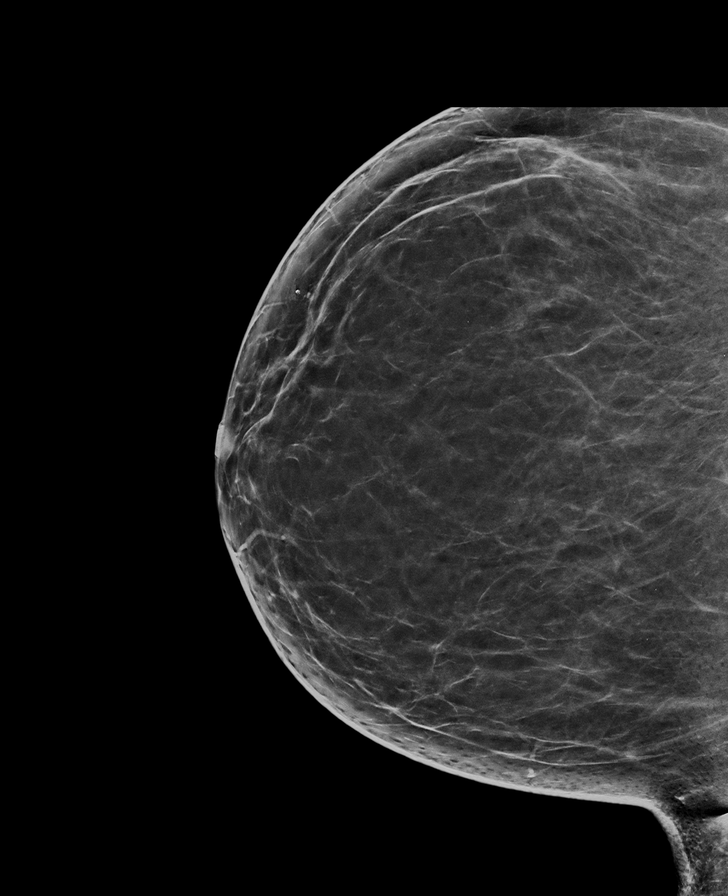

[R CC]
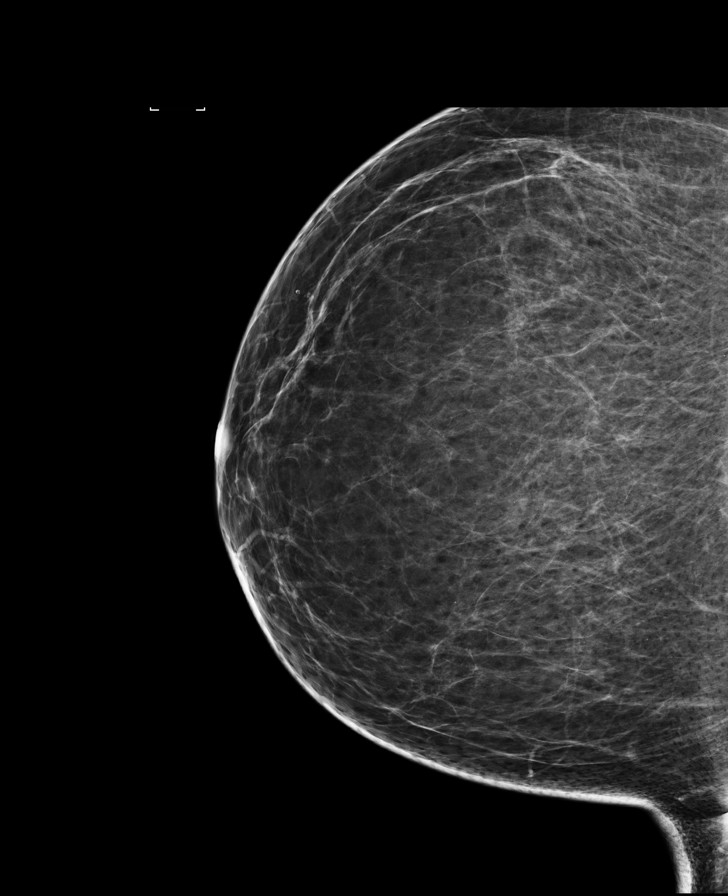

[L MLO]
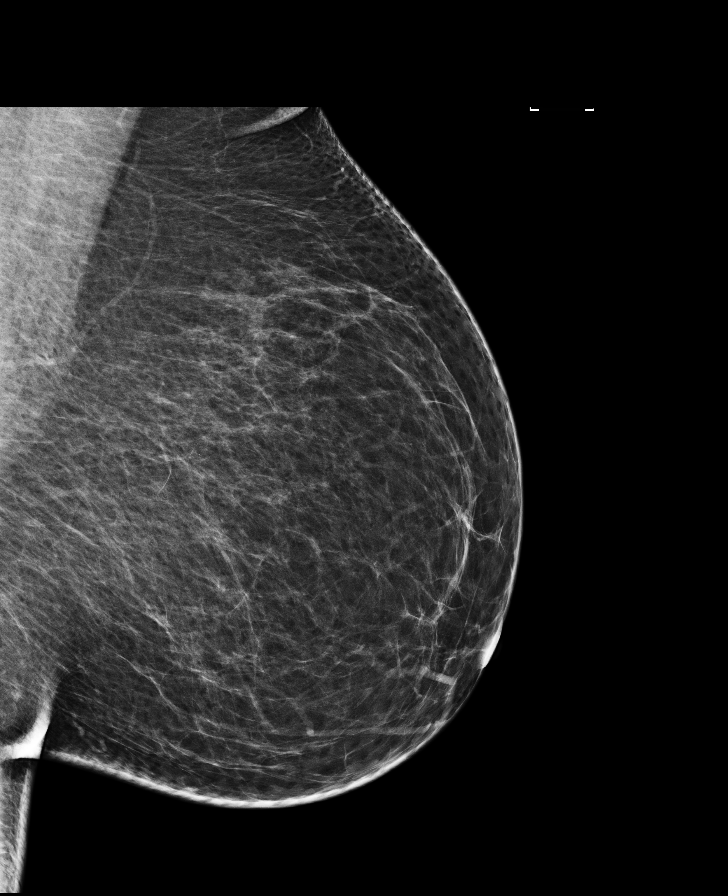

[R MLO]
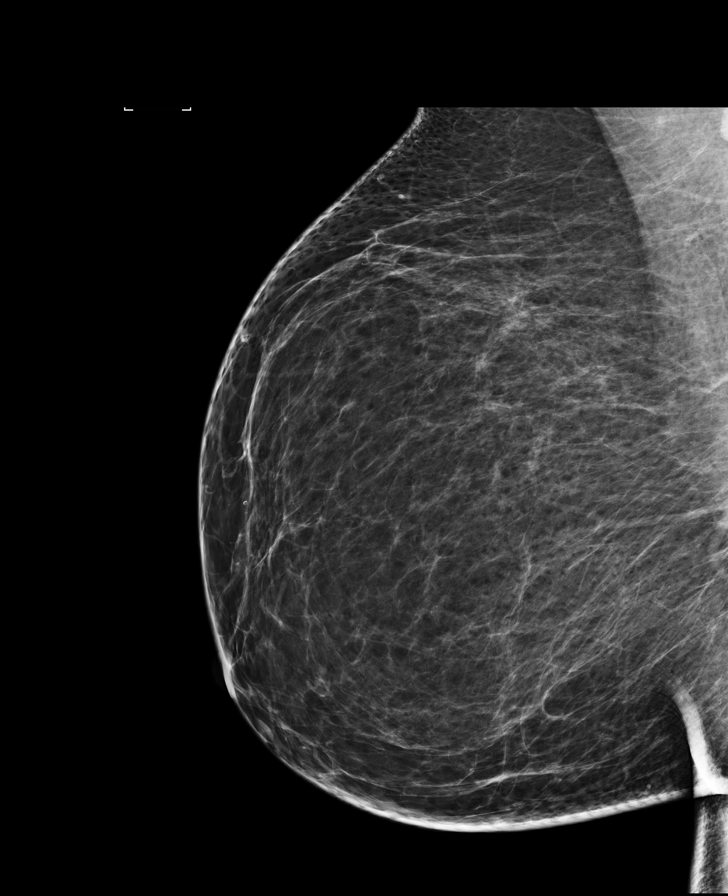

[L MLO synth-2D]
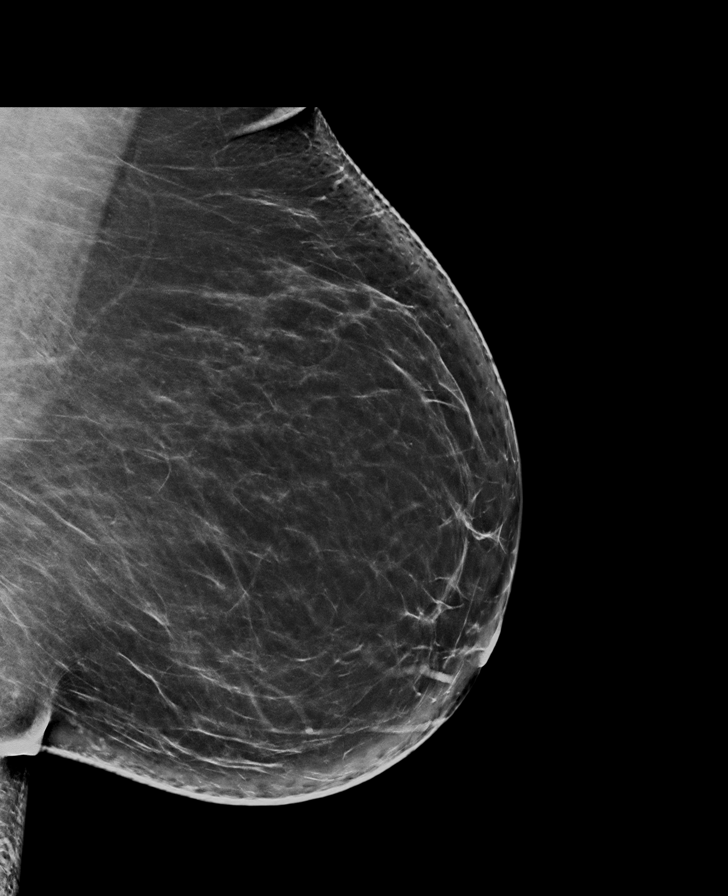

[R MLO synth-2D]
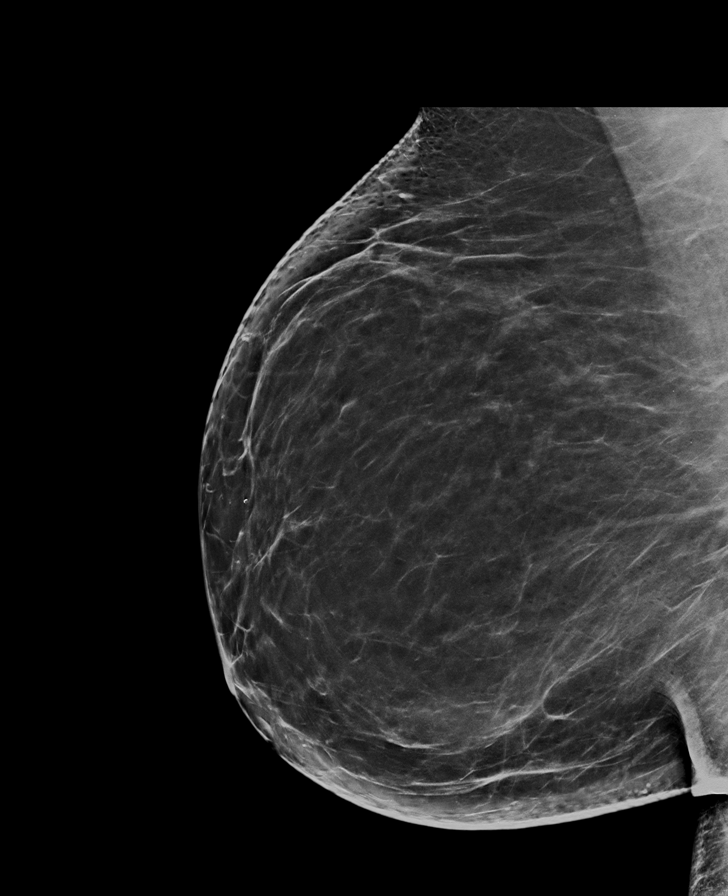

[8 of 28 positions shown; findings below may reference images not displayed]

ACR Breast Density Category b: There are scattered areas of
fibroglandular density.
FINDINGS: There are no findings suspicious for malignancy. Images were
processed with CAD.
IMPRESSION: No mammographic evidence of malignancy. A result letter of this
screening mammogram will be mailed directly to the patient.

RECOMMENDATION:
Screening mammogram in one year. (Code:55-L-[id number])

BI-RADS CATEGORY  1: Negative.

## 2017-08-31 MED FILL — SYNTHROID 112 MCG TABLET: 112 | 30 days supply | Qty: 30 | Fill #10

## 2017-10-07 MED FILL — SYNTHROID 112 MCG TABLET: 112 | 30 days supply | Qty: 30 | Fill #11

## 2017-10-22 DIAGNOSIS — Z23 Encounter for immunization: Secondary | ICD-10-CM | POA: Diagnosis not present

## 2017-10-22 DIAGNOSIS — Z1509 Genetic susceptibility to other malignant neoplasm: Secondary | ICD-10-CM | POA: Diagnosis not present

## 2017-10-22 DIAGNOSIS — E039 Hypothyroidism, unspecified: Secondary | ICD-10-CM | POA: Diagnosis not present

## 2018-01-21 DIAGNOSIS — L400 Psoriasis vulgaris: Secondary | ICD-10-CM | POA: Diagnosis not present

## 2018-01-26 ENCOUNTER — Ambulatory Visit: Payer: 59 | Admitting: Obstetrics & Gynecology

## 2018-01-28 ENCOUNTER — Ambulatory Visit: Payer: 59 | Admitting: Obstetrics & Gynecology

## 2018-01-28 NOTE — Progress Notes (Deleted)
47 y.o. [id number] Significant Other White or Caucasian female here for annual exam.    Patient's last menstrual period was 04/09/2015.          Sexually active: {yes no:314532}  The current method of family planning is status post hysterectomy.    Exercising: {yes no:314532}  {types:19826} Smoker:  {YES NO:22349}  Health Maintenance: Pap:  04/05/15 Neg   04/13/14 Neg. HR HPV:neg  History of abnormal Pap:  yes MMG:  04/27/15 BIRADS1:neg  Colonoscopy:  05/02/15 polyps.  BMD:   *** TDaP:  2015 Pneumonia vaccine(s):  *** Shingrix:   *** Hep C testing: *** Screening Labs: ***, Hb today: ***, Urine today: ***   reports that she has never smoked. She has never used smokeless tobacco. She reports that she does not drink alcohol or use drugs.  Past Medical History:  Diagnosis Date  . Abnormal Pap smear    ASCUS-H +HPV 11/11, 8/12  [place], 5/13 ASCUS HPV-  . AMA (advanced maternal age) multigravida 22+   . Colon polyps   . Complication of anesthesia 12 yrs ago   woke up in middle of anesthesia for eye surgery  . Depression    pp depression x2  - no problems at present  . Depression, postpartum   . FHx: colon cancer   . Gallstones    3 or 4 gallbladder attacks - with abdominal pain, nausea and vomiting and reflux  . GERD (gastroesophageal reflux disease)   . H/O varicella   . Hypothyroidism   . 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  . Obese   . Vaginal delivery 2003, 2008, 2014    Past Surgical History:  Procedure Laterality Date  . CHOLECYSTECTOMY N/A 04/18/2013   Procedure: LAPAROSCOPIC CHOLECYSTECTOMY WITH INTRAOPERATIVE CHOLANGIOGRAM;  Surgeon: Odis Hollingshead, MD;  Location: [facility];  Service: General;  Laterality: N/A;  . COLONOSCOPY WITH PROPOFOL N/A 05/02/2015   Procedure: COLONOSCOPY WITH PROPOFOL;  Surgeon: Arta Silence, MD;  Location: [facility];  Service: Endoscopy;  Laterality: N/A;  . COLPOSCOPY     2/12,8/12 HPV   changes only  . CYSTO N/A 05/21/2015   Procedure: CYSTO;  Surgeon: Megan Salon, MD;  Location: [place] [facility];  Service: Gynecology;  Laterality: N/A;  . ESOPHAGOGASTRODUODENOSCOPY (EGD) WITH PROPOFOL N/A 05/02/2015   Procedure: ESOPHAGOGASTRODUODENOSCOPY (EGD) WITH PROPOFOL;  Surgeon: Arta Silence, MD;  Location: [facility];  Service: Endoscopy;  Laterality: N/A;  . EYE SURGERY     L eye  . LAPAROSCOPIC BILATERAL SALPINGO OOPHERECTOMY Bilateral 05/21/2015   Procedure: LAPAROSCOPIC BILATERAL SALPINGO OOPHORECTOMY;  Surgeon: Megan Salon, MD;  Location: [place] [facility] [facility];  Service: Gynecology;  Laterality: Bilateral;  . LAPAROSCOPIC HYSTERECTOMY N/A 05/21/2015   Procedure: HYSTERECTOMY TOTAL LAPAROSCOPIC;  Surgeon: Megan Salon, MD;  Location: [facility];  Service: Gynecology;  Laterality: N/A;  . LAPAROSCOPY     for ovarian cyst  . MOUTH SURGERY    . TONSILLECTOMY      Current Outpatient Medications  Medication Sig Dispense Refill  . metFORMIN (GLUCOPHAGE) 500 MG tablet Take 1 tablet (500 mg total) by mouth daily with breakfast. 30 tablet 0  . SYNTHROID 112 MCG tablet   2  . Vitamin D, Ergocalciferol, (DRISDOL) 50000 units CAPS capsule Take 1 capsule (50,000 Units total) by mouth every 7 (seven) days. 4 capsule 0   No current facility-administered medications for this visit.     Family History  Problem Relation Age of Onset  .  Arthritis Mother   . COPD Mother   . Cancer Mother        colon and lung  . Depression Mother   . Diabetes Mother   . Heart disease Mother   . Hyperlipidemia Mother   . Hypertension Mother   . Hypertension Father   . Depression Father   . Depression Sister   . Kidney disease Sister   . Hypothyroidism Sister   . Other Sister        enlarged heart  . Fibromyalgia Sister   . Heart attack Sister   . Colon cancer Brother   . Cancer Maternal Aunt        NOS  . Melanoma Paternal Uncle   . SIDS Brother   . Lung cancer Maternal Aunt     Review of Systems  Exam:   LMP  04/09/2015   Height:      Ht Readings from Last 3 Encounters:  07/29/17 '5\' 8"'$  (1.727 m)  07/06/17 '5\' 8"'$  (1.727 m)  06/22/17 '5\' 8"'$  (1.727 m)    General appearance: alert, cooperative and appears stated age Head: Normocephalic, without obvious abnormality, atraumatic Neck: no adenopathy, supple, symmetrical, trachea midline and thyroid {EXAM; THYROID:18604} Lungs: clear to auscultation bilaterally Breasts: {Exam; breast:13139::"normal appearance, no masses or tenderness"} Heart: regular rate and rhythm Abdomen: soft, non-tender; bowel sounds normal; no masses,  no organomegaly Extremities: extremities normal, atraumatic, no cyanosis or edema Skin: Skin color, texture, turgor normal. No rashes or lesions Lymph nodes: Cervical, supraclavicular, and axillary nodes normal. No abnormal inguinal nodes palpated Neurologic: Grossly normal   Pelvic: External genitalia:  no lesions              Urethra:  normal appearing urethra with no masses, tenderness or lesions              Bartholins and Skenes: normal                 Vagina: normal appearing vagina with normal color and discharge, no lesions              Cervix: {exam; cervix:14595}              Pap taken: {yes no:314532} Bimanual Exam:  Uterus:  {exam; uterus:12215}              Adnexa: {exam; adnexa:12223}               Rectovaginal: Confirms               Anus:  normal sphincter tone, no lesions  Chaperone was present for exam.  A:  Well Woman with normal exam  P:   {plan; gyn:5269::"mammogram","pap smear","return annually or prn"}

## 2018-04-22 DIAGNOSIS — R7309 Other abnormal glucose: Secondary | ICD-10-CM | POA: Diagnosis not present

## 2018-04-22 DIAGNOSIS — E039 Hypothyroidism, unspecified: Secondary | ICD-10-CM | POA: Diagnosis not present

## 2018-04-22 DIAGNOSIS — Z1509 Genetic susceptibility to other malignant neoplasm: Secondary | ICD-10-CM | POA: Diagnosis not present

## 2018-11-16 DIAGNOSIS — Z0000 Encounter for general adult medical examination without abnormal findings: Secondary | ICD-10-CM | POA: Diagnosis not present

## 2019-05-09 ENCOUNTER — Encounter: Payer: Self-pay | Admitting: Certified Nurse Midwife
